# Patient Record
Sex: Male | Born: 1965
Health system: Southern US, Community
[De-identification: ages and names within clinical notes are randomized; demographics above are authoritative.]

## PROBLEM LIST (undated history)

## (undated) DIAGNOSIS — R011 Cardiac murmur, unspecified: Secondary | ICD-10-CM

## (undated) DIAGNOSIS — R569 Unspecified convulsions: Secondary | ICD-10-CM

## (undated) DIAGNOSIS — T7840XA Allergy, unspecified, initial encounter: Secondary | ICD-10-CM

## (undated) HISTORY — DX: Unspecified convulsions: R56.9

## (undated) HISTORY — PX: HERNIA REPAIR: SHX51

## (undated) HISTORY — DX: Allergy, unspecified, initial encounter: T78.40XA

## (undated) HISTORY — PX: VASECTOMY: SHX75

## (undated) HISTORY — DX: Cardiac murmur, unspecified: R01.1

---
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
## 1999-06-24 ENCOUNTER — Encounter: Payer: Self-pay | Admitting: Internal Medicine

## 2003-11-13 ENCOUNTER — Ambulatory Visit (HOSPITAL_BASED_OUTPATIENT_CLINIC_OR_DEPARTMENT_OTHER): Admission: RE | Admit: 2003-11-13 | Discharge: 2003-11-13 | Payer: Self-pay | Admitting: Urology

## 2007-06-12 ENCOUNTER — Encounter: Admission: RE | Admit: 2007-06-12 | Discharge: 2007-06-12 | Payer: Self-pay | Admitting: Orthopedic Surgery

## 2007-08-21 ENCOUNTER — Ambulatory Visit: Payer: Self-pay | Admitting: Internal Medicine

## 2007-08-26 ENCOUNTER — Ambulatory Visit: Payer: Self-pay | Admitting: Family Medicine

## 2007-08-28 LAB — CONVERTED CEMR LAB
Cholesterol: 183 mg/dL (ref 0–200)
HDL: 32.2 mg/dL — ABNORMAL LOW (ref >39.0)
Triglycerides: 54 mg/dL (ref 0–149)
VLDL: 11 mg/dL (ref 0–40)

## 2008-03-03 ENCOUNTER — Ambulatory Visit: Payer: Self-pay | Admitting: Family Medicine

## 2010-08-12 NOTE — Op Note (Signed)
NAMEMEHMET, SCALLY                             ACCOUNT NO.:  [id number]   MEDICAL RECORD NO.:  [id number]                   PATIENT TYPE:  AMB   LOCATION:  [facility]                                 FACILITY:  [facility]   PHYSICIAN:  Rozanna Boer., M.D.      DATE OF BIRTH:  Apr 17, 1965   DATE OF PROCEDURE:  11/13/2003  DATE OF DISCHARGE:                                 OPERATIVE REPORT   PREOPERATIVE DIAGNOSES:  Elective sterilization, multiple groin and penile  condylomata.   POSTOPERATIVE DIAGNOSES:  Elective sterilization, multiple groin and penile  condylomata.   OPERATION:  Bilateral vas ligation, YAG laser of multiple penile and groin  condylomata.   ANESTHESIA:  General.   SURGEON:  Courtney Paris, M.D.   BRIEF HISTORY:  This 45 year old patient is admitted for elective  sterilization to have a vasectomy but was noted to have multiple groin and  penile condylomata because of the size of these things which were more than  2 cm in both groins.  He enters to have these done under general anesthesia  with the YAG laser as well as the vasectomy.   The patient was placed supine, slightly frog legged on the operating table  and after satisfactory induction of general anesthesia was prepped and  draped with Betadine in the usual sterile fashion.  The vas in the left  upper scrotum was picked up and a small transverse incision made across the  upper scrotum on the left side, the vas was delivered in the operative  field, transected and clamped. This was then tied with 3-0 chromic catgut  suture leaving a gap.  The vas was dropped back in the left hemiscrotal  compartment and on the right side, the vas was similarly delivered through a  small transverse upper scrotal incision and the vas was dissected free and  clamped, ligated with 3-0 chromic catgut suture.  Hemostasis was good.  The  skin was closed with interrupted 4-0 chromic catgut suture and dressing  collodion was  applied.   The YAG laser with the contact tip was then calibrated at 15 watts and the  lesion first on the penis which was about a centimeter was photo irradiated  until it turned white.  The large lesions in both groins which were over 2  cm each were then also photo irradiated and I tried to pick off some of the  eschar.  When all the viable wart appeared to be treated on both sides, I  then covered the wounds with Neosporin ointment and he was then taken to the  recovery room in good condition to be later discharged as an outpatient with  detailed written instructions.  Rozanna Boer., M.D.    HMK/MEDQ  D:  11/13/2003  T:  11/14/2003  Job:  [id number]

## 2010-12-13 ENCOUNTER — Other Ambulatory Visit: Payer: Self-pay | Admitting: Ophthalmology

## 2010-12-13 DIAGNOSIS — H4710 Unspecified papilledema: Secondary | ICD-10-CM

## 2010-12-17 ENCOUNTER — Other Ambulatory Visit: Payer: Self-pay

## 2010-12-19 ENCOUNTER — Other Ambulatory Visit: Payer: Self-pay

## 2012-07-05 ENCOUNTER — Ambulatory Visit (INDEPENDENT_AMBULATORY_CARE_PROVIDER_SITE_OTHER): Payer: BC Managed Care – PPO | Admitting: Internal Medicine

## 2012-07-05 ENCOUNTER — Encounter: Payer: Self-pay | Admitting: Internal Medicine

## 2012-07-05 VITALS — BP 130/80 | HR 75 | Wt 219.0 lb

## 2012-07-05 DIAGNOSIS — L918 Other hypertrophic disorders of the skin: Secondary | ICD-10-CM | POA: Insufficient documentation

## 2012-07-05 DIAGNOSIS — L909 Atrophic disorder of skin, unspecified: Secondary | ICD-10-CM

## 2012-07-05 NOTE — Progress Notes (Signed)
  Subjective:    Patient ID: Marcus Cochran, male    DOB: 1965/05/03, 47 y.o.   MRN: [id number]  HPI Hasn't been here in some time Has been doing well  Has 1 irritated skin tag in right axilla Needs this removed Tried tying dental floss but didn't get tight enough  No current outpatient prescriptions on file prior to visit.   No current facility-administered medications on file prior to visit.    Allergies  Allergen Reactions  . Prednisone     REACTION: blurry vision    No past medical history on file.  Past Surgical History  Procedure Laterality Date  . Hernia repair Right ~1972    Inguinal  . Knee arthroscopy Right 1981    Family History  Problem Relation Age of Onset  . Multiple sclerosis Mother     possible    History   Social History  . Marital Status: Married    Spouse Name: N/A    Number of Children: 0  . Years of Education: N/A   Occupational History  . Not on file.   Social History Main Topics  . Smoking status: Never Smoker   . Smokeless tobacco: Never Used  . Alcohol Use: Yes     Comment: 1-2 daily at most  . Drug Use: No  . Sexually Active: Not on file   Other Topics Concern  . Not on file   Social History Narrative  . No narrative on file      Review of Systems     Objective:   Physical Exam  Constitutional: He appears well-developed and well-nourished. No distress.  Skin:  Partially thrombosed skin tag in right axilla tender          Assessment & Plan:

## 2012-07-05 NOTE — Patient Instructions (Signed)

## 2012-07-05 NOTE — Assessment & Plan Note (Signed)
Partially thrombosed and base is inflamed Liquid nitrogen 45 seconds x 2 Discussed home care

## 2012-07-15 ENCOUNTER — Ambulatory Visit: Payer: Self-pay | Admitting: Internal Medicine

## 2013-01-15 ENCOUNTER — Ambulatory Visit (INDEPENDENT_AMBULATORY_CARE_PROVIDER_SITE_OTHER): Payer: BC Managed Care – PPO | Admitting: Internal Medicine

## 2013-01-15 ENCOUNTER — Encounter: Payer: Self-pay | Admitting: Internal Medicine

## 2013-01-15 VITALS — BP 120/70 | HR 72 | Temp 98.9°F | Wt 221.5 lb

## 2013-01-15 DIAGNOSIS — IMO0001 Reserved for inherently not codable concepts without codable children: Secondary | ICD-10-CM | POA: Insufficient documentation

## 2013-01-15 DIAGNOSIS — L03019 Cellulitis of unspecified finger: Secondary | ICD-10-CM

## 2013-01-15 MED ORDER — CEPHALEXIN 500 MG PO CAPS: 500.0000 mg | ORAL_CAPSULE | Freq: Four times a day (QID) | ORAL | Status: DC

## 2013-01-15 NOTE — Assessment & Plan Note (Signed)
Discussed hot packing Will treat with cephalexin

## 2013-01-15 NOTE — Progress Notes (Signed)
  Subjective:    Patient ID: Marcus Cochran, male    DOB: 02-09-1966, 47 y.o.   MRN: [id number]  HPI Has some swelling in right middle finger---distal Tried to see if he could drain it with sterilized straight knife--didn't get any drainage  Doesn't remember injury or bite Does bite nails---may have had trauma to that Tender to touch but not bad pain  No current outpatient prescriptions on file prior to visit.   No current facility-administered medications on file prior to visit.    Allergies  Allergen Reactions  . Prednisone     REACTION: blurry vision    No past medical history on file.  Past Surgical History  Procedure Laterality Date  . Hernia repair Right ~1972    Inguinal  . Knee arthroscopy Right 1981    Family History  Problem Relation Age of Onset  . Multiple sclerosis Mother     possible  . Diabetes Neg Hx   . Heart disease Neg Hx   . Hypertension Neg Hx     History   Social History  . Marital Status: Married    Spouse Name: N/A    Number of Children: 0  . Years of Education: N/A   Occupational History  . [employer]    Social History Main Topics  . Smoking status: Never Smoker   . Smokeless tobacco: Never Used  . Alcohol Use: Yes     Comment: 1-2 daily at most  . Drug Use: No  . Sexual Activity: Not on file   Other Topics Concern  . Not on file   Social History Narrative  . No narrative on file   Review of Systems No fever Not sick    Objective:   Physical Exam  Constitutional: He appears well-developed and well-nourished. No distress.  Musculoskeletal:  Swelling, redness and tenderness of distal right 3rd finger Some green discoloration at nail edge          Assessment & Plan:

## 2013-01-30 ENCOUNTER — Other Ambulatory Visit: Payer: Self-pay

## 2014-01-13 ENCOUNTER — Encounter: Payer: Self-pay | Admitting: Internal Medicine

## 2014-01-13 ENCOUNTER — Ambulatory Visit (INDEPENDENT_AMBULATORY_CARE_PROVIDER_SITE_OTHER): Payer: BC Managed Care – PPO | Admitting: Internal Medicine

## 2014-01-13 VITALS — BP 140/80 | HR 78 | Temp 97.9°F | Wt 224.0 lb

## 2014-01-13 DIAGNOSIS — R251 Tremor, unspecified: Secondary | ICD-10-CM

## 2014-01-13 DIAGNOSIS — Z23 Encounter for immunization: Secondary | ICD-10-CM

## 2014-01-13 LAB — CBC WITH DIFFERENTIAL/PLATELET
BASOS PCT: 0.5 % (ref 0.0–3.0)
Basophils Absolute: 0 10*3/uL (ref 0.0–0.1)
EOS ABS: 0.1 10*3/uL (ref 0.0–0.7)
EOS PCT: 1 % (ref 0.0–5.0)
HCT: 46.5 % (ref 39.0–52.0)
HEMOGLOBIN: 15.7 g/dL (ref 13.0–17.0)
LYMPHS PCT: 33 % (ref 12.0–46.0)
Lymphs Abs: 1.8 10*3/uL (ref 0.7–4.0)
MCHC: 33.7 g/dL (ref 30.0–36.0)
MCV: 90.7 fl (ref 78.0–100.0)
MONOS PCT: 9.9 % (ref 3.0–12.0)
Monocytes Absolute: 0.5 10*3/uL (ref 0.1–1.0)
NEUTROS PCT: 55.6 % (ref 43.0–77.0)
Neutro Abs: 3 10*3/uL (ref 1.4–7.7)
Platelets: 216 10*3/uL (ref 150.0–400.0)
RBC: 5.12 Mil/uL (ref 4.22–5.81)
RDW: 13.2 % (ref 11.5–15.5)
WBC: 5.4 10*3/uL (ref 4.0–10.5)

## 2014-01-13 LAB — COMPREHENSIVE METABOLIC PANEL
ALBUMIN: 4 g/dL (ref 3.5–5.2)
ALK PHOS: 59 U/L (ref 39–117)
ALT: 35 U/L (ref 0–53)
AST: 26 U/L (ref 0–37)
BUN: 16 mg/dL (ref 6–23)
CALCIUM: 9.5 mg/dL (ref 8.4–10.5)
CHLORIDE: 102 meq/L (ref 96–112)
CO2: 27 mEq/L (ref 19–32)
Creatinine, Ser: 1.2 mg/dL (ref 0.4–1.5)
GFR: 71.88 mL/min (ref >60.00)
GLUCOSE: 87 mg/dL (ref 70–99)
POTASSIUM: 4.3 meq/L (ref 3.5–5.1)
SODIUM: 138 meq/L (ref 135–145)
TOTAL PROTEIN: 8 g/dL (ref 6.0–8.3)
Total Bilirubin: 0.9 mg/dL (ref 0.2–1.2)

## 2014-01-13 LAB — LIPID PANEL
CHOL/HDL RATIO: 5
CHOLESTEROL: 238 mg/dL — AB (ref 0–200)
HDL: 43.7 mg/dL (ref >39.00)
LDL CALC: 155 mg/dL — AB (ref 0–99)
NonHDL: 194.3
Triglycerides: 196 mg/dL — ABNORMAL HIGH (ref 0.0–149.0)
VLDL: 39.2 mg/dL (ref 0.0–40.0)

## 2014-01-13 LAB — T4, FREE: Free T4: 1.07 ng/dL (ref 0.60–1.60)

## 2014-01-13 NOTE — Patient Instructions (Signed)
DASH Eating Plan °DASH stands for "Dietary Approaches to Stop Hypertension." The DASH eating plan is a healthy eating plan that has been shown to reduce high blood pressure (hypertension). Additional health benefits may include reducing the risk of type 2 diabetes mellitus, heart disease, and stroke. The DASH eating plan may also help with weight loss. °WHAT DO I NEED TO KNOW ABOUT THE DASH EATING PLAN? °For the DASH eating plan, you will follow these general guidelines: °· Choose foods with a percent daily value for sodium of less than 5% (as listed on the food label). °· Use salt-free seasonings or herbs instead of table salt or sea salt. °· Check with your health care provider or pharmacist before using salt substitutes. °· Eat lower-sodium products, often labeled as "lower sodium" or "no salt added." °· Eat fresh foods. °· Eat more vegetables, fruits, and low-fat dairy products. °· Choose whole grains. Look for the word "whole" as the first word in the ingredient list. °· Choose fish and skinless chicken or turkey more often than red meat. Limit fish, poultry, and meat to 6 oz (170 g) each day. °· Limit sweets, desserts, sugars, and sugary drinks. °· Choose heart-healthy fats. °· Limit cheese to 1 oz (28 g) per day. °· Eat more home-cooked food and less restaurant, buffet, and fast food. °· Limit fried foods. °· Cook foods using methods other than frying. °· Limit canned vegetables. If you do use them, rinse them well to decrease the sodium. °· When eating at a restaurant, ask that your food be prepared with less salt, or no salt if possible. °WHAT FOODS CAN I EAT? °Seek help from a dietitian for individual calorie needs. °Grains °Whole grain or whole wheat bread. Brown rice. Whole grain or whole wheat pasta. Quinoa, bulgur, and whole grain cereals. Low-sodium cereals. Corn or whole wheat flour tortillas. Whole grain cornbread. Whole grain crackers. Low-sodium crackers. °Vegetables °Fresh or frozen vegetables  (raw, steamed, roasted, or grilled). Low-sodium or reduced-sodium tomato and vegetable juices. Low-sodium or reduced-sodium tomato sauce and paste. Low-sodium or reduced-sodium canned vegetables.  °Fruits °All fresh, canned (in natural juice), or frozen fruits. °Meat and Other Protein Products °Ground beef (85% or leaner), grass-fed beef, or beef trimmed of fat. Skinless chicken or turkey. Ground chicken or turkey. Pork trimmed of fat. All fish and seafood. Eggs. Dried beans, peas, or lentils. Unsalted nuts and seeds. Unsalted canned beans. °Dairy °Low-fat dairy products, such as skim or 1% milk, 2% or reduced-fat cheeses, low-fat ricotta or cottage cheese, or plain low-fat yogurt. Low-sodium or reduced-sodium cheeses. °Fats and Oils °Tub margarines without trans fats. Light or reduced-fat mayonnaise and salad dressings (reduced sodium). Avocado. Safflower, olive, or canola oils. Natural peanut or almond butter. °Other °Unsalted popcorn and pretzels. °The items listed above may not be a complete list of recommended foods or beverages. Contact your dietitian for more options. °WHAT FOODS ARE NOT RECOMMENDED? °Grains °White bread. White pasta. White rice. Refined cornbread. Bagels and croissants. Crackers that contain trans fat. °Vegetables °Creamed or fried vegetables. Vegetables in a cheese sauce. Regular canned vegetables. Regular canned tomato sauce and paste. Regular tomato and vegetable juices. °Fruits °Dried fruits. Canned fruit in light or heavy syrup. Fruit juice. °Meat and Other Protein Products °Fatty cuts of meat. Ribs, chicken wings, bacon, sausage, bologna, salami, chitterlings, fatback, hot dogs, bratwurst, and packaged luncheon meats. Salted nuts and seeds. Canned beans with salt. °Dairy °Whole or 2% milk, cream, half-and-half, and cream cheese. Whole-fat or sweetened yogurt. Full-fat   cheeses or blue cheese. Nondairy creamers and whipped toppings. Processed cheese, cheese spreads, or cheese  curds. °Condiments °Onion and garlic salt, seasoned salt, table salt, and sea salt. Canned and packaged gravies. Worcestershire sauce. Tartar sauce. Barbecue sauce. Teriyaki sauce. Soy sauce, including reduced sodium. Steak sauce. Fish sauce. Oyster sauce. Cocktail sauce. Horseradish. Ketchup and mustard. Meat flavorings and tenderizers. Bouillon cubes. Hot sauce. Tabasco sauce. Marinades. Taco seasonings. Relishes. °Fats and Oils °Butter, stick margarine, lard, shortening, ghee, and bacon fat. Coconut, palm kernel, or palm oils. Regular salad dressings. °Other °Pickles and olives. Salted popcorn and pretzels. °The items listed above may not be a complete list of foods and beverages to avoid. Contact your dietitian for more information. °WHERE CAN I FIND MORE INFORMATION? °National Heart, [employer], and [employer] Institute: www.nhlbi.nih.gov/health/health-topics/topics/dash/ °Document Released: 03/02/2011 Document Revised: 07/28/2013 Document Reviewed: 01/15/2013 °Exit[employer]® Patient Information ©2015 [employer]. This information is not intended to replace advice given to you by your health care provider. Make sure you discuss any questions you have with your health care provider. ° °

## 2014-01-13 NOTE — Progress Notes (Signed)
Pre visit review using our clinic review tool, if applicable. No additional management support is needed unless otherwise documented below in the visit note. 

## 2014-01-13 NOTE — Progress Notes (Signed)
   Subjective:    Patient ID: Marcus Cochran, male    DOB: April 18, 1965, 48 y.o.   MRN: [id number]  HPI Wife worried about him being diabetic While on tennis court--started to get the shakes after exerting energy Better after gatorade powder Does have increased urinary frequency--more urgency No polydipsia  Worried about B12 also No parasthesias or memory changes  Weight is stable Wearing heavy work boots Does play tennis at least once a week--- singles and doubles. 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   No current facility-administered medications on file prior to visit.    Allergies  Allergen Reactions  . Prednisone     REACTION: blurry vision    No past medical history on file.  Past Surgical History  Procedure Laterality Date  . Hernia repair Right ~1972    Inguinal  . Knee arthroscopy Right 1981    Family History  Problem Relation Age of Onset  . Multiple sclerosis Mother     possible  . Diabetes Neg Hx   . Heart disease Neg Hx   . Hypertension Neg Hx     History   Social History  . Marital Status: Married    Spouse Name: N/A    Number of Children: 0  . Years of Education: N/A   Occupational History  . [employer]    Social History Main Topics  . Smoking status: Never Smoker   . Smokeless tobacco: Never Used  . Alcohol Use: Yes     Comment: 1-2 daily at most  . Drug Use: No  . Sexual Activity: Not on file   Other Topics Concern  . Not on file   Social History Narrative  . No narrative on file    Review of Systems Brews beer--1-2 many days and as much as 3-4 on a weekend Sleeps okay    Objective:   Physical Exam  Constitutional: He is oriented to person, place, and time. He appears well-developed and well-nourished. No  distress.  HENT:  Mouth/Throat: Oropharynx is clear and moist. No oropharyngeal exudate.  Neck: Normal range of motion. Neck supple. No thyromegaly present.  Cardiovascular: Normal rate, regular rhythm, normal heart sounds and intact distal pulses.  Exam reveals no gallop.   No murmur heard. Pulmonary/Chest: Effort normal and breath sounds normal. No respiratory distress. He has no wheezes. He has no rales.  Abdominal: Soft. There is no tenderness.  Musculoskeletal: He exhibits no edema and no tenderness.  Lymphadenopathy:    He has no cervical adenopathy.  Neurological: He is alert and oriented to person, place, and time.  Skin: No rash noted. No erythema.  Psychiatric: He has a normal mood and affect. His behavior is normal.          Assessment & Plan:

## 2014-01-13 NOTE — Assessment & Plan Note (Signed)
Sounds like he may have had brief low sugar reaction I doubt diabetes Discussed proper eating and his expected ideal body weight Will check labs

## 2014-01-13 NOTE — Addendum Note (Signed)
Addended by: Despina Hidden on: 01/13/2014 12:08 PM   Modules accepted: Orders

## 2014-01-23 ENCOUNTER — Ambulatory Visit: Payer: BC Managed Care – PPO | Admitting: Internal Medicine

## 2015-01-02 ENCOUNTER — Encounter: Payer: Self-pay | Admitting: Internal Medicine

## 2017-04-23 ENCOUNTER — Ambulatory Visit (INDEPENDENT_AMBULATORY_CARE_PROVIDER_SITE_OTHER): Payer: Managed Care, Other (non HMO) | Admitting: Internal Medicine

## 2017-04-23 ENCOUNTER — Encounter: Payer: Self-pay | Admitting: Internal Medicine

## 2017-04-23 VITALS — BP 122/70 | HR 89 | Temp 97.9°F | Resp 18 | Wt 228.8 lb

## 2017-04-23 DIAGNOSIS — J22 Unspecified acute lower respiratory infection: Secondary | ICD-10-CM | POA: Diagnosis not present

## 2017-04-23 MED ORDER — AZITHROMYCIN 250 MG PO TABS: ORAL_TABLET | ORAL | 0 refills | Status: DC

## 2017-04-23 NOTE — Assessment & Plan Note (Signed)
History most consistent with atypical infection Discussed options ---will treat with z-pak OTC cough meds

## 2017-04-23 NOTE — Progress Notes (Signed)
   Subjective:    Patient ID: Marcus Cochran, male    DOB: 1965/06/21, 52 y.o.   MRN: [id number]  HPI Here due to respiratory illness  Having chest congestion now Sinus problems about a month ago--better with sinus meds and vitamin C Seemed to improve a couple of weeks ago--with some cough and wheezing at various times of the day Tried cough syrup--helped him last night  Not really SOB Intermittent cough---slightly productive No fever No chills or sweats No ear pain Slight sore throat  No current outpatient medications on file prior to visit.   No current facility-administered medications on file prior to visit.     Allergies  Allergen Reactions  . Prednisone     REACTION: blurry vision    History reviewed. No pertinent past medical history.  Past Surgical History:  Procedure Laterality Date  . HERNIA REPAIR Right ~1972   Inguinal  . KNEE ARTHROSCOPY Right 1981    Family History  Problem Relation Age of Onset  . Multiple sclerosis Mother        possible  . Diabetes Neg Hx   . Heart disease Neg Hx   . Hypertension Neg Hx     Social History   Socioeconomic History  . Marital status: Married    Spouse name: Not on file  . Number of children: 0  . Years of education: Not on file  . Highest education level: Not on file  Social Needs  . Financial resource strain: Not on file  . Food insecurity - worry: Not on file  . Food insecurity - inability: Not on file  . Transportation needs - medical: Not on file  . Transportation needs - non-medical: Not on file  Occupational History  . Occupation: Chief Financial Officer  Tobacco Use  . Smoking status: Never Smoker  . Smokeless tobacco: Never Used  Substance and Sexual Activity  . Alcohol use: Yes    Comment: 1-2 daily at most  . Drug use: No  . Sexual activity: Not on file  Other Topics Concern  . Not on file  Social History Narrative  . Not on file   Review of Systems Very thirsty--- and drinks  water Appetite is fine No vomiting or diarrhea No rash Wife was sick--- now clearing up    Objective:   Physical Exam  Constitutional: He appears well-developed. No distress.  HENT:  Mouth/Throat: Oropharynx is clear and moist. No oropharyngeal exudate.  No sinus tenderness Tms normal Mild nasal congestion   Neck: No thyromegaly present.  Pulmonary/Chest: Effort normal. No respiratory distress. He has no wheezes. He has no rales.  Lymphadenopathy:    He has no cervical adenopathy.          Assessment & Plan:

## 2017-10-23 ENCOUNTER — Ambulatory Visit: Payer: Self-pay | Admitting: Internal Medicine

## 2017-10-31 ENCOUNTER — Encounter: Payer: Self-pay | Admitting: Gastroenterology

## 2017-10-31 ENCOUNTER — Encounter: Payer: Self-pay | Admitting: Internal Medicine

## 2017-10-31 ENCOUNTER — Ambulatory Visit (INDEPENDENT_AMBULATORY_CARE_PROVIDER_SITE_OTHER): Payer: Managed Care, Other (non HMO) | Admitting: Internal Medicine

## 2017-10-31 VITALS — BP 124/84 | HR 70 | Temp 98.0°F | Ht 70.5 in | Wt 219.0 lb

## 2017-10-31 DIAGNOSIS — Z0000 Encounter for general adult medical examination without abnormal findings: Secondary | ICD-10-CM | POA: Diagnosis not present

## 2017-10-31 DIAGNOSIS — Z1211 Encounter for screening for malignant neoplasm of colon: Secondary | ICD-10-CM | POA: Diagnosis not present

## 2017-10-31 LAB — LIPID PANEL
CHOLESTEROL: 198 mg/dL (ref 0–200)
HDL: 45.5 mg/dL (ref >39.00)
LDL CALC: 126 mg/dL — AB (ref 0–99)
NonHDL: 152.17
TRIGLYCERIDES: 129 mg/dL (ref 0.0–149.0)
Total CHOL/HDL Ratio: 4
VLDL: 25.8 mg/dL (ref 0.0–40.0)

## 2017-10-31 LAB — GLUCOSE, RANDOM: GLUCOSE: 98 mg/dL (ref 70–99)

## 2017-10-31 NOTE — Patient Instructions (Signed)
DASH Eating Plan DASH stands for "Dietary Approaches to Stop Hypertension." The DASH eating plan is a healthy eating plan that has been shown to reduce high blood pressure (hypertension). It may also reduce your risk for type 2 diabetes, heart disease, and stroke. The DASH eating plan may also help with weight loss. What are tips for following this plan? General guidelines  Avoid eating more than 2,300 mg (milligrams) of salt (sodium) a day. If you have hypertension, you may need to reduce your sodium intake to 1,500 mg a day.  Limit alcohol intake to no more than 1 drink a day for nonpregnant women and 2 drinks a day for men. One drink equals 12 oz of beer, 5 oz of wine, or 1 oz of hard liquor.  Work with your health care provider to maintain a healthy body weight or to lose weight. Ask what an ideal weight is for you.  Get at least 30 minutes of exercise that causes your heart to beat faster (aerobic exercise) most days of the week. Activities may include walking, swimming, or biking.  Work with your health care provider or diet and nutrition specialist (dietitian) to adjust your eating plan to your individual calorie needs. Reading food labels  Check food labels for the amount of sodium per serving. Choose foods with less than 5 percent of the Daily Value of sodium. Generally, foods with less than 300 mg of sodium per serving fit into this eating plan.  To find whole grains, look for the word "whole" as the first word in the ingredient list. Shopping  Buy products labeled as "low-sodium" or "no salt added."  Buy fresh foods. Avoid canned foods and premade or frozen meals. Cooking  Avoid adding salt when cooking. Use salt-free seasonings or herbs instead of table salt or sea salt. Check with your health care provider or pharmacist before using salt substitutes.  Do not fry foods. Cook foods using healthy methods such as baking, boiling, grilling, and broiling instead.  Cook with  heart-healthy oils, such as olive, canola, soybean, or sunflower oil. Meal planning   Eat a balanced diet that includes: ? 5 or more servings of fruits and vegetables each day. At each meal, try to fill half of your plate with fruits and vegetables. ? Up to 6-8 servings of whole grains each day. ? Less than 6 oz of lean meat, poultry, or fish each day. A 3-oz serving of meat is about the same size as a deck of cards. One egg equals 1 oz. ? 2 servings of low-fat dairy each day. ? A serving of nuts, seeds, or beans 5 times each week. ? Heart-healthy fats. Healthy fats called Omega-3 fatty acids are found in foods such as flaxseeds and coldwater fish, like sardines, salmon, and mackerel.  Limit how much you eat of the following: ? Canned or prepackaged foods. ? Food that is high in trans fat, such as fried foods. ? Food that is high in saturated fat, such as fatty meat. ? Sweets, desserts, sugary drinks, and other foods with added sugar. ? Full-fat dairy products.  Do not salt foods before eating.  Try to eat at least 2 vegetarian meals each week.  Eat more home-cooked food and less restaurant, buffet, and fast food.  When eating at a restaurant, ask that your food be prepared with less salt or no salt, if possible. What foods are recommended? The items listed may not be a complete list. Talk with your dietitian about what   dietary choices are best for you. Grains Whole-grain or whole-wheat bread. Whole-grain or whole-wheat pasta. Brown rice. Oatmeal. Quinoa. Bulgur. Whole-grain and low-sodium cereals. Pita bread. Low-fat, low-sodium crackers. Whole-wheat flour tortillas. Vegetables Fresh or frozen vegetables (raw, steamed, roasted, or grilled). Low-sodium or reduced-sodium tomato and vegetable juice. Low-sodium or reduced-sodium tomato sauce and tomato paste. Low-sodium or reduced-sodium canned vegetables. Fruits All fresh, dried, or frozen fruit. Canned fruit in natural juice (without  added sugar). Meat and other protein foods Skinless chicken or turkey. Ground chicken or turkey. Pork with fat trimmed off. Fish and seafood. Egg whites. Dried beans, peas, or lentils. Unsalted nuts, nut butters, and seeds. Unsalted canned beans. Lean cuts of beef with fat trimmed off. Low-sodium, lean deli meat. Dairy Low-fat (1%) or fat-free (skim) milk. Fat-free, low-fat, or reduced-fat cheeses. Nonfat, low-sodium ricotta or cottage cheese. Low-fat or nonfat yogurt. Low-fat, low-sodium cheese. Fats and oils Soft margarine without trans fats. Vegetable oil. Low-fat, reduced-fat, or light mayonnaise and salad dressings (reduced-sodium). Canola, safflower, olive, soybean, and sunflower oils. Avocado. Seasoning and other foods Herbs. Spices. Seasoning mixes without salt. Unsalted popcorn and pretzels. Fat-free sweets. What foods are not recommended? The items listed may not be a complete list. Talk with your dietitian about what dietary choices are best for you. Grains Baked goods made with fat, such as croissants, muffins, or some breads. Dry pasta or rice meal packs. Vegetables Creamed or fried vegetables. Vegetables in a cheese sauce. Regular canned vegetables (not low-sodium or reduced-sodium). Regular canned tomato sauce and paste (not low-sodium or reduced-sodium). Regular tomato and vegetable juice (not low-sodium or reduced-sodium). Pickles. Olives. Fruits Canned fruit in a light or heavy syrup. Fried fruit. Fruit in cream or butter sauce. Meat and other protein foods Fatty cuts of meat. Ribs. Fried meat. Bacon. Sausage. Bologna and other processed lunch meats. Salami. Fatback. Hotdogs. Bratwurst. Salted nuts and seeds. Canned beans with added salt. Canned or smoked fish. Whole eggs or egg yolks. Chicken or turkey with skin. Dairy Whole or 2% milk, cream, and half-and-half. Whole or full-fat cream cheese. Whole-fat or sweetened yogurt. Full-fat cheese. Nondairy creamers. Whipped toppings.  Processed cheese and cheese spreads. Fats and oils Butter. Stick margarine. Lard. Shortening. Ghee. Bacon fat. Tropical oils, such as coconut, palm kernel, or palm oil. Seasoning and other foods Salted popcorn and pretzels. Onion salt, garlic salt, seasoned salt, table salt, and sea salt. Worcestershire sauce. Tartar sauce. Barbecue sauce. Teriyaki sauce. Soy sauce, including reduced-sodium. Steak sauce. Canned and packaged gravies. Fish sauce. Oyster sauce. Cocktail sauce. Horseradish that you find on the shelf. Ketchup. Mustard. Meat flavorings and tenderizers. Bouillon cubes. Hot sauce and Tabasco sauce. Premade or packaged marinades. Premade or packaged taco seasonings. Relishes. Regular salad dressings. Where to find more information:  National Heart, Lung, and Blood Institute: www.nhlbi.nih.gov  American Heart Association: www.heart.org Summary  The DASH eating plan is a healthy eating plan that has been shown to reduce high blood pressure (hypertension). It may also reduce your risk for type 2 diabetes, heart disease, and stroke.  With the DASH eating plan, you should limit salt (sodium) intake to 2,300 mg a day. If you have hypertension, you may need to reduce your sodium intake to 1,500 mg a day.  When on the DASH eating plan, aim to eat more fresh fruits and vegetables, whole grains, lean proteins, low-fat dairy, and heart-healthy fats.  Work with your health care provider or diet and nutrition specialist (dietitian) to adjust your eating plan to your individual   calorie needs. This information is not intended to replace advice given to you by your health care provider. Make sure you discuss any questions you have with your health care provider. Document Released: 03/02/2011 Document Revised: 03/06/2016 Document Reviewed: 03/06/2016 Elsevier Interactive Patient Education  2018 Elsevier Inc.  

## 2017-10-31 NOTE — Assessment & Plan Note (Signed)
Healthy Discussed fitness Recommended yearly flu shot--he is not sure Will set up colon Consider PSA at 57

## 2017-10-31 NOTE — Progress Notes (Signed)
Subjective:    Patient ID: Marcus Cochran, male    DOB: November 06, 1965, 52 y.o.   MRN: [id number]  HPI Here for physical  No new health concerns Same job  No current outpatient medications on file prior to visit.   No current facility-administered medications on file prior to visit.     Allergies  Allergen Reactions  . Prednisone     REACTION: blurry vision    History reviewed. No pertinent past medical history.  Past Surgical History:  Procedure Laterality Date  . HERNIA REPAIR Right ~1972   Inguinal  . KNEE ARTHROSCOPY Right 1981    Family History  Problem Relation Age of Onset  . Multiple sclerosis Mother        possible  . Diabetes Neg Hx   . Heart disease Neg Hx   . Hypertension Neg Hx     Social History   Socioeconomic History  . Marital status: Married    Spouse name: Not on file  . Number of children: 0  . Years of education: Not on file  . Highest education level: Not on file  Occupational History  . 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 Financial resource strain: Not on file  . Food insecurity:    Worry: Not on file    Inability: Not on file  . Transportation needs:    Medical: Not on file    Non-medical: Not on file  Tobacco Use  . Smoking status: Never Smoker  . Smokeless tobacco: Never Used  Substance and Sexual Activity  . Alcohol use: Yes    Comment: 1-2 daily at most  . Drug use: No  . Sexual activity: Not on file  Lifestyle  . Physical activity:    Days per week: Not on file    Minutes per session: Not on file  . Stress: Not on file  Relationships  . Social connections:    Talks on phone: Not on file    Gets together: Not on file    Attends religious service: Not on file    Active member of club or organization: Not on file    Attends meetings of clubs or organizations: Not on file    Relationship status: Not on file  . Intimate partner violence:    Fear of current or ex partner: Not on file    Emotionally abused:  Not on file    Physically abused: Not on file    Forced sexual activity: Not on file  Other Topics Concern  . Not on file  Social History Narrative  . Not on file   Review of Systems  Constitutional:       Plays tennis twice a week or more Starting resistance bands Wears seat belt  HENT: Negative for dental problem, hearing loss, tinnitus and trouble swallowing.        Keeps up with dentist  Eyes: Negative for visual disturbance.       No diplopia or unilateral vision loss  Respiratory: Negative for cough, chest tightness and shortness of breath.   Cardiovascular: Negative for chest pain, palpitations and leg swelling.  Gastrointestinal: Negative for blood in stool and constipation.       No heartburn  Endocrine: Negative for polydipsia and polyuria.  Genitourinary: Negative for difficulty urinating and urgency.       No sexual problems  Musculoskeletal: Negative for arthralgias and joint swelling.       Recurrent low back pain--only once in a while  Skin:  Negative for rash.       No suspicious lesions but more sun spots  Allergic/Immunologic: Negative for environmental allergies and immunocompromised state.  Neurological: Negative for dizziness, syncope, light-headedness and headaches.  Hematological: Negative for adenopathy. Does not bruise/bleed easily.  Psychiatric/Behavioral: Negative for dysphoric mood and sleep disturbance. The patient is not nervous/anxious.        Objective:   Physical Exam  Constitutional: He is oriented to person, place, and time. He appears well-developed. No distress.  HENT:  Head: Normocephalic and atraumatic.  Right Ear: External ear normal.  Left Ear: External ear normal.  Mouth/Throat: Oropharynx is clear and moist. No oropharyngeal exudate.  Eyes: Pupils are equal, round, and reactive to light. Conjunctivae are normal.  Neck: No thyromegaly present.  Cardiovascular: Normal rate, regular rhythm, normal heart sounds and intact distal  pulses. Exam reveals no gallop.  No murmur heard. Respiratory: Effort normal and breath sounds normal. No respiratory distress. He has no wheezes. He has no rales.  GI: Soft. There is no tenderness.  Musculoskeletal: He exhibits no edema or tenderness.  Lymphadenopathy:    He has no cervical adenopathy.  Neurological: He is alert and oriented to person, place, and time.  Skin: No rash noted. No erythema.  Multiple benign nevi  Psychiatric: He has a normal mood and affect. His behavior is normal.           Assessment & Plan:

## 2017-12-14 ENCOUNTER — Ambulatory Visit (AMBULATORY_SURGERY_CENTER): Payer: Self-pay

## 2017-12-14 ENCOUNTER — Encounter: Payer: Self-pay | Admitting: Gastroenterology

## 2017-12-14 VITALS — Ht 71.0 in | Wt 221.0 lb

## 2017-12-14 DIAGNOSIS — Z1211 Encounter for screening for malignant neoplasm of colon: Secondary | ICD-10-CM

## 2017-12-14 MED ORDER — NA SULFATE-K SULFATE-MG SULF 17.5-3.13-1.6 GM/177ML PO SOLN: 1.0000 | Freq: Once | ORAL | 0 refills | Status: AC

## 2017-12-14 NOTE — Progress Notes (Signed)
Per pt, no allergies to soy or egg products.Pt not taking any weight loss meds or using  O2 at home.  Pt refused emmi video. 

## 2017-12-28 ENCOUNTER — Encounter: Payer: Self-pay | Admitting: Gastroenterology

## 2017-12-28 ENCOUNTER — Ambulatory Visit (AMBULATORY_SURGERY_CENTER): Payer: Managed Care, Other (non HMO) | Admitting: Gastroenterology

## 2017-12-28 VITALS — BP 119/70 | HR 59 | Temp 99.3°F | Resp 13 | Ht 70.5 in | Wt 219.0 lb

## 2017-12-28 DIAGNOSIS — D122 Benign neoplasm of ascending colon: Secondary | ICD-10-CM | POA: Diagnosis not present

## 2017-12-28 DIAGNOSIS — Z1211 Encounter for screening for malignant neoplasm of colon: Secondary | ICD-10-CM | POA: Diagnosis present

## 2017-12-28 MED ORDER — SODIUM CHLORIDE 0.9 % IV SOLN: 500.0000 mL | Freq: Once | INTRAVENOUS | Status: DC

## 2017-12-28 NOTE — Progress Notes (Signed)
No changes in medical or surgical hx since PV per pt 

## 2017-12-28 NOTE — Patient Instructions (Signed)
**   Handouts given on polyps and diverticulosis **   YOU HAD AN ENDOSCOPIC PROCEDURE TODAY AT THE [facility]:   Refer to the procedure report that was given to you for any specific questions about what was found during the examination.  If the procedure report does not answer your questions, please call your gastroenterologist to clarify.  If you requested that your care partner not be given the details of your procedure findings, then the procedure report has been included in a sealed envelope for you to review at your convenience later.  YOU SHOULD EXPECT: Some feelings of bloating in the abdomen. Passage of more gas than usual.  Walking can help get rid of the air that was put into your GI tract during the procedure and reduce the bloating. If you had a lower endoscopy (such as a colonoscopy or flexible sigmoidoscopy) you may notice spotting of blood in your stool or on the toilet paper. If you underwent a bowel prep for your procedure, you may not have a normal bowel movement for a few days.  Please Note:  You might notice some irritation and congestion in your nose or some drainage.  This is from the oxygen used during your procedure.  There is no need for concern and it should clear up in a day or so.  SYMPTOMS TO REPORT IMMEDIATELY:   Following lower endoscopy (colonoscopy or flexible sigmoidoscopy):  Excessive amounts of blood in the stool  Significant tenderness or worsening of abdominal pains  Swelling of the abdomen that is new, acute  Fever of 100F or higher  For urgent or emergent issues, a gastroenterologist can be reached at any hour by calling [phone].   DIET:  We do recommend a small meal at first, but then you may proceed to your regular diet.  Drink plenty of fluids but you should avoid alcoholic beverages for 24 hours.  ACTIVITY:  You should plan to take it easy for the rest of today and you should NOT DRIVE or use heavy machinery until tomorrow (because  of the sedation medicines used during the test).    FOLLOW UP: Our staff will call the number listed on your records the next business day following your procedure to check on you and address any questions or concerns that you may have regarding the information given to you following your procedure. If we do not reach you, we will leave a message.  However, if you are feeling well and you are not experiencing any problems, there is no need to return our call.  We will assume that you have returned to your regular daily activities without incident.  If any biopsies were taken you will be contacted by phone or by letter within the next 1-3 weeks.  Please call us at [phone] if you have not heard about the biopsies in 3 weeks.    SIGNATURES/CONFIDENTIALITY: You and/or your care partner have signed paperwork which will be entered into your electronic medical record.  These signatures attest to the fact that that the information above on your After Visit Summary has been reviewed and is understood.  Full responsibility of the confidentiality of this discharge information lies with you and/or your care-partner. 

## 2017-12-28 NOTE — Op Note (Signed)
[place] Patient Name: Marcus Cochran Procedure Date: 12/28/2017 10:27 AM MRN: [id number] Endoscopist: Mallie Mussel L. Loletha Carrow , MD Age: 52 Referring MD:  Date of Birth: May 22, 1965 Gender: Male Account #: [id number] Procedure:                Colonoscopy Indications:              Screening for colorectal malignant neoplasm, This                            is the patient's first colonoscopy Medicines:                Monitored Anesthesia Care Procedure:                Pre-Anesthesia Assessment:                           - Prior to the procedure, a History and Physical                            was performed, and patient medications and                            allergies were reviewed. The patient's tolerance of                            previous anesthesia was also reviewed. The risks                            and benefits of the procedure and the sedation                            options and risks were discussed with the patient.                            All questions were answered, and informed consent                            was obtained. Prior Anticoagulants: The patient has                            taken no previous anticoagulant or antiplatelet                            agents. ASA Grade Assessment: II - A patient with                            mild systemic disease. After reviewing the risks                            and benefits, the patient was deemed in                            satisfactory condition to undergo the procedure.  After obtaining informed consent, the colonoscope                            was passed under direct vision. Throughout the                            procedure, the patient's blood pressure, pulse, and                            oxygen saturations were monitored continuously. The                            Colonoscope was introduced through the anus and                            advanced to the the cecum,  identified by                            appendiceal orifice and ileocecal valve. The                            colonoscopy was performed without difficulty. The                            patient tolerated the procedure well. The quality                            of the bowel preparation was good. The ileocecal                            valve, appendiceal orifice, and rectum were                            photographed. The quality of the bowel preparation                            was evaluated using the BBPS [facility] Bowel                            Preparation Scale) with scores of: Right Colon = 2,                            Transverse Colon = 2 and Left Colon = 2. The total                            BBPS score equals 6. The bowel preparation used was                            SUPREP. Scope In: 10:41:53 AM Scope Out: 10:59:38 AM Scope Withdrawal Time: 0 hours 14 minutes 4 seconds  Total Procedure Duration: 0 hours 17 minutes 45 seconds  Findings:                 The perianal and digital rectal  examinations were                            normal.                           An 8-10 mm polyp with a mucus cap was found in the                            proximal ascending colon. The polyp was sessile.                            The polyp was removed with a hot snare. Resection                            and retrieval were complete.                           A 2 mm polyp was found in the distal ascending                            colon. The polyp was sessile and                            adenomatous-appearing under WL and NBI. The polyp                            was removed with a cold snare. Resection was                            complete, but the polyp tissue was not retrieved                            (destroyed by scope suction).                           Multiple diverticula were found in the left colon.                           The exam was otherwise without abnormality  on                            direct and retroflexion views. Complications:            No immediate complications. Estimated Blood Loss:     Estimated blood loss was minimal. Impression:               - One 8-10 mm polyp in the proximal ascending                            colon, removed with a hot snare. Resected and                            retrieved.                           -  One 2 mm polyp in the distal ascending colon,                            removed with a cold snare. Complete resection.                            Polyp tissue not retrieved.                           - Diverticulosis in the left colon.                           - The examination was otherwise normal on direct                            and retroflexion views. Recommendation:           - Patient has a contact number available for                            emergencies. The signs and symptoms of potential                            delayed complications were discussed with the                            patient. Return to normal activities tomorrow.                            Written discharge instructions were provided to the                            patient.                           - Resume previous diet.                           - Continue present medications.                           - Await pathology results.                           - Repeat colonoscopy is recommended for                            surveillance. The colonoscopy date will be                            determined after pathology results from today's                            exam become available for review. Mariellen Blaney L. Loletha Carrow, MD 12/28/2017 11:04:26 AM This report has been signed electronically.

## 2017-12-28 NOTE — Progress Notes (Signed)
To recovery, report to RN, VSS. 

## 2017-12-28 NOTE — Progress Notes (Signed)
Called to room to assist during endoscopic procedure.  Patient ID and intended procedure confirmed with present staff. Received instructions for my participation in the procedure from the performing physician.  

## 2017-12-31 ENCOUNTER — Telehealth: Payer: Self-pay

## 2017-12-31 NOTE — Telephone Encounter (Signed)
  Follow up Call-  Call back number 12/28/2017  Post procedure Call Back phone  # [phone]  Permission to leave phone message Yes  Some recent data might be hidden     Left message

## 2017-12-31 NOTE — Telephone Encounter (Signed)
  Follow up Call-  Call back number 12/28/2017  Post procedure Call Back phone  # [phone]  Permission to leave phone message Yes  Some recent data might be hidden     Patient questions:  Do you have a fever, pain , or abdominal swelling? No. Pain Score  0 *  Have you tolerated food without any problems? Yes.    Have you been able to return to your normal activities? Yes.    Do you have any questions about your discharge instructions: Diet   No. Medications  No. Follow up visit  No.  Do you have questions or concerns about your Care? No.  Actions: * If pain score is 4 or above: No action needed, pain <4.

## 2018-01-04 ENCOUNTER — Encounter: Payer: Self-pay | Admitting: Gastroenterology

## 2018-04-04 DIAGNOSIS — M722 Plantar fascial fibromatosis: Secondary | ICD-10-CM | POA: Diagnosis not present

## 2018-04-04 DIAGNOSIS — M9906 Segmental and somatic dysfunction of lower extremity: Secondary | ICD-10-CM | POA: Diagnosis not present

## 2018-04-04 DIAGNOSIS — M25531 Pain in right wrist: Secondary | ICD-10-CM | POA: Diagnosis not present

## 2018-04-04 DIAGNOSIS — M25671 Stiffness of right ankle, not elsewhere classified: Secondary | ICD-10-CM | POA: Diagnosis not present

## 2018-04-11 DIAGNOSIS — M25671 Stiffness of right ankle, not elsewhere classified: Secondary | ICD-10-CM | POA: Diagnosis not present

## 2018-04-11 DIAGNOSIS — M25531 Pain in right wrist: Secondary | ICD-10-CM | POA: Diagnosis not present

## 2018-04-11 DIAGNOSIS — M722 Plantar fascial fibromatosis: Secondary | ICD-10-CM | POA: Diagnosis not present

## 2018-04-11 DIAGNOSIS — M9906 Segmental and somatic dysfunction of lower extremity: Secondary | ICD-10-CM | POA: Diagnosis not present

## 2018-04-18 DIAGNOSIS — M25531 Pain in right wrist: Secondary | ICD-10-CM | POA: Diagnosis not present

## 2018-04-18 DIAGNOSIS — M25671 Stiffness of right ankle, not elsewhere classified: Secondary | ICD-10-CM | POA: Diagnosis not present

## 2018-04-18 DIAGNOSIS — M9906 Segmental and somatic dysfunction of lower extremity: Secondary | ICD-10-CM | POA: Diagnosis not present

## 2018-04-18 DIAGNOSIS — M722 Plantar fascial fibromatosis: Secondary | ICD-10-CM | POA: Diagnosis not present

## 2018-04-30 DIAGNOSIS — M9906 Segmental and somatic dysfunction of lower extremity: Secondary | ICD-10-CM | POA: Diagnosis not present

## 2018-04-30 DIAGNOSIS — M722 Plantar fascial fibromatosis: Secondary | ICD-10-CM | POA: Diagnosis not present

## 2018-04-30 DIAGNOSIS — M25671 Stiffness of right ankle, not elsewhere classified: Secondary | ICD-10-CM | POA: Diagnosis not present

## 2018-04-30 DIAGNOSIS — M7918 Myalgia, other site: Secondary | ICD-10-CM | POA: Diagnosis not present

## 2018-10-11 DIAGNOSIS — M545 Low back pain: Secondary | ICD-10-CM | POA: Diagnosis not present

## 2018-10-11 DIAGNOSIS — M542 Cervicalgia: Secondary | ICD-10-CM | POA: Diagnosis not present

## 2018-10-11 DIAGNOSIS — M65849 Other synovitis and tenosynovitis, unspecified hand: Secondary | ICD-10-CM | POA: Diagnosis not present

## 2018-10-11 DIAGNOSIS — M9901 Segmental and somatic dysfunction of cervical region: Secondary | ICD-10-CM | POA: Diagnosis not present

## 2018-12-29 DIAGNOSIS — Z20828 Contact with and (suspected) exposure to other viral communicable diseases: Secondary | ICD-10-CM | POA: Diagnosis not present

## 2019-06-13 ENCOUNTER — Ambulatory Visit (INDEPENDENT_AMBULATORY_CARE_PROVIDER_SITE_OTHER): Payer: BC Managed Care – PPO | Admitting: Internal Medicine

## 2019-06-13 ENCOUNTER — Other Ambulatory Visit: Payer: Self-pay

## 2019-06-13 ENCOUNTER — Encounter: Payer: Self-pay | Admitting: Internal Medicine

## 2019-06-13 DIAGNOSIS — R41840 Attention and concentration deficit: Secondary | ICD-10-CM

## 2019-06-13 NOTE — Assessment & Plan Note (Signed)
Clearly has fatigue, sleep disturbance and attention problems related to his current work schedule Discussed that due to clear reason for his problems, he doesn't have ADHD I can certify that he is medically not able to do the split shifts (causing significant medical issues)---he will let me know if he needs this

## 2019-06-13 NOTE — Progress Notes (Signed)
Subjective:    Patient ID: Marcus Cochran, male    DOB: 09-Aug-1965, 54 y.o.   MRN: [id number]  HPI Here due to concerns about ADHD This visit occurred during the SARS-CoV-2 public health emergency.  Safety protocols were in place, including screening questions prior to the visit, additional usage of staff PPE, and extensive cleaning of exam room while observing appropriate contact time as indicated for disinfecting solutions.    "Nothing positive---work is wearing me out" Working double swing shifts---first and third often Will come in 8-4PM---then has to come back at midnight to 5-6AM Only tries to get nap in between. Will be off the next day--then back to first shift (and sometimes the night also) Boss got on him some---had to repeat something he made a mistake on and had to be done after hours Normal days are Mon-Fri 8-4PM On call a week at a time--- 24/7 (7 weeks a year). Not having to go out as much lately, but more often in the past  Wife wondered about ADHD Saw a counselor and got papers filled out Always had trouble focusing for long periods of time Tends to daydream--but okay if clear "task at hand"  Current Outpatient Medications on File Prior to Visit  Medication Sig Dispense Refill  . Cyanocobalamin (B-12) 1000 MCG SUBL Place under the tongue daily.    . Multiple Vitamins-Minerals (EMERGEN-C IMMUNE PO) Take by mouth as needed.    . TURMERIC PO Take by mouth daily.    Marland Kitchen b complex vitamins tablet Take 1 tablet by mouth daily.    . Multiple Vitamin (MULTIVITAMIN) tablet Take 1 tablet by mouth daily.     No current facility-administered medications on file prior to visit.    Allergies  Allergen Reactions  . Prednisone     REACTION: blurry vision    Past Medical History:  Diagnosis Date  . Allergy    seasonal  . Heart murmur    as a 54 year old  . Seizures ([place])    as a child and when smoking pot/ no meds- last one in his 20's    Past Surgical History:   Procedure Laterality Date  . HERNIA REPAIR Right ~1972   Inguinal  . KNEE ARTHROSCOPY Right 1981  . VASECTOMY      Family History  Problem Relation Age of Onset  . Multiple sclerosis Mother        possible  . Bipolar disorder Sister   . Leukemia Maternal Aunt   . Diabetes Neg Hx   . Heart disease Neg Hx   . Hypertension Neg Hx   . Colon cancer Neg Hx   . Esophageal cancer Neg Hx   . Rectal cancer Neg Hx   . Stomach cancer Neg Hx     Social History   Socioeconomic History  . Marital status: Married    Spouse name: Not on file  . Number of children: 0  . Years of education: Not on file  . Highest education level: Not on file  Occupational History  . 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 Smoking status: Never Smoker  . Smokeless tobacco: Never Used  Substance and Sexual Activity  . Alcohol use: Yes    Alcohol/week: 14.0 standard drinks    Types: 14 Standard drinks or equivalent per week    Comment: 1-2 daily at most  . Drug use: Not Currently  . Sexual activity: Not on file  Other Topics Concern  . Not on file  Social History Narrative  . Not on file   Social Determinants of Health   Financial Resource Strain:   . Difficulty of Paying Living Expenses:   Food Insecurity:   . Worried About Charity fundraiser in the Last Year:   . Arboriculturist in the Last Year:   Transportation Needs:   . Film/video editor (Medical):   Marland Kitchen Lack of Transportation (Non-Medical):   Physical Activity:   . Days of Exercise per Week:   . Minutes of Exercise per Session:   Stress:   . Feeling of Stress :   Social Connections:   . Frequency of Communication with Friends and Family:   . Frequency of Social Gatherings with Friends and Family:   . Attends Religious Services:   . Active Member of Clubs or Organizations:   . Attends Archivist Meetings:   Marland Kitchen Marital Status:   Intimate Partner Violence:   . Fear of Current or Ex-Partner:   . Emotionally  Abused:   Marland Kitchen Physically Abused:   . Sexually Abused:    Review of Systems Having sleep disorder due to the schedule Eating okay    Objective:   Physical Exam  Constitutional: He appears well-developed. No distress.  Psychiatric: He has a normal mood and affect. His behavior is normal.           Assessment & Plan:

## 2019-07-06 DIAGNOSIS — Z20828 Contact with and (suspected) exposure to other viral communicable diseases: Secondary | ICD-10-CM | POA: Diagnosis not present

## 2019-07-22 DIAGNOSIS — Z03818 Encounter for observation for suspected exposure to other biological agents ruled out: Secondary | ICD-10-CM | POA: Diagnosis not present

## 2019-07-22 DIAGNOSIS — Z20828 Contact with and (suspected) exposure to other viral communicable diseases: Secondary | ICD-10-CM | POA: Diagnosis not present

## 2019-12-17 ENCOUNTER — Encounter: Payer: BC Managed Care – PPO | Admitting: Internal Medicine

## 2020-05-04 DIAGNOSIS — F900 Attention-deficit hyperactivity disorder, predominantly inattentive type: Secondary | ICD-10-CM | POA: Diagnosis not present

## 2020-05-20 DIAGNOSIS — F902 Attention-deficit hyperactivity disorder, combined type: Secondary | ICD-10-CM | POA: Diagnosis not present

## 2020-05-20 DIAGNOSIS — F411 Generalized anxiety disorder: Secondary | ICD-10-CM | POA: Diagnosis not present

## 2020-06-15 DIAGNOSIS — Z20822 Contact with and (suspected) exposure to covid-19: Secondary | ICD-10-CM | POA: Diagnosis not present

## 2020-07-23 ENCOUNTER — Other Ambulatory Visit: Payer: Self-pay

## 2020-07-23 ENCOUNTER — Encounter: Payer: Self-pay | Admitting: Internal Medicine

## 2020-07-23 ENCOUNTER — Ambulatory Visit (INDEPENDENT_AMBULATORY_CARE_PROVIDER_SITE_OTHER)
Admission: RE | Admit: 2020-07-23 | Discharge: 2020-07-23 | Disposition: A | Discharge status: Home / Self Care | Payer: BC Managed Care – PPO | Source: Ambulatory Visit | Attending: Internal Medicine | Admitting: Internal Medicine

## 2020-07-23 ENCOUNTER — Ambulatory Visit (INDEPENDENT_AMBULATORY_CARE_PROVIDER_SITE_OTHER): Payer: BC Managed Care – PPO | Admitting: Internal Medicine

## 2020-07-23 VITALS — BP 110/74 | HR 72 | Temp 98.1°F | Ht 71.0 in | Wt 231.0 lb

## 2020-07-23 DIAGNOSIS — S99922A Unspecified injury of left foot, initial encounter: Secondary | ICD-10-CM | POA: Diagnosis not present

## 2020-07-23 DIAGNOSIS — M79672 Pain in left foot: Secondary | ICD-10-CM | POA: Insufficient documentation

## 2020-07-23 DIAGNOSIS — M7732 Calcaneal spur, left foot: Secondary | ICD-10-CM | POA: Diagnosis not present

## 2020-07-23 NOTE — Progress Notes (Signed)
Subjective:    Patient ID: Marcus Cochran, male    DOB: October 13, 1965, 55 y.o.   MRN: [id number]  HPI Here due to left ankle injury This visit occurred during the SARS-CoV-2 public health emergency.  Safety protocols were in place, including screening questions prior to the visit, additional usage of staff PPE, and extensive cleaning of exam room while observing appropriate contact time as indicated for disinfecting solutions.   Plays tennis a lot---3 weeks ago, he noticed pain in left foot when walking out after the game Didn't remember an injury Rested it for a week or so---seemed better Played again--only 30 minutes-- felt okay 2 days later, he played again---and felt a lot of pain within 20 minutes but finished the match Again decided to rest it--that was 2 weeks ago Still having pain--especially if he is on it Using a lace up brace  No clear swelling Tried ibuprofen 200mg -- unclear if helped (does okay with he rests anyway)  Current Outpatient Medications on File Prior to Visit  Medication Sig Dispense Refill  . Cholecalciferol 50 MCG (2000 UT) TABS Take 1 tablet by mouth daily.    . Multiple Vitamins-Minerals (EMERGEN-C IMMUNE PO) Take by mouth as needed.    . TURMERIC PO Take by mouth daily.     No current facility-administered medications on file prior to visit.    Allergies  Allergen Reactions  . Prednisone     REACTION: blurry vision    Past Medical History:  Diagnosis Date  . Allergy    seasonal  . Heart murmur    as a 55 year old  . Seizures ([place])    as a child and when smoking pot/ no meds- last one in his 20's    Past Surgical History:  Procedure Laterality Date  . HERNIA REPAIR Right ~1972   Inguinal  . KNEE ARTHROSCOPY Right 1981  . VASECTOMY      Family History  Problem Relation Age of Onset  . Multiple sclerosis Mother        possible  . Bipolar disorder Sister   . Leukemia Maternal Aunt   . Diabetes Neg Hx   . Heart disease Neg Hx   .  Hypertension Neg Hx   . Colon cancer Neg Hx   . Esophageal cancer Neg Hx   . Rectal cancer Neg Hx   . Stomach cancer Neg Hx     Social History   Socioeconomic History  . Marital status: Married    Spouse name: Not on file  . Number of children: 0  . Years of education: Not on file  . Highest education level: Not on file  Occupational History  . 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 Smoking status: Never Smoker  . Smokeless tobacco: Never Used  Vaping Use  . Vaping Use: Never used  Substance and Sexual Activity  . Alcohol use: Yes    Alcohol/week: 14.0 standard drinks    Types: 14 Standard drinks or equivalent per week    Comment: 1-2 daily at most  . Drug use: Not Currently  . Sexual activity: Not on file  Other Topics Concern  . Not on file  Social History Narrative  . Not on file   Social Determinants of Health   Financial Resource Strain: Not on file  Food Insecurity: Not on file  Transportation Needs: Not on file  Physical Activity: Not on file  Stress: Not on file  Social Connections: Not on file  Intimate Partner Violence:  Not on file   Review of Systems  Did try icing sometimes  No other joint issues     Objective:   Physical Exam Musculoskeletal:     Comments: Left ankle exam normal Tenderness over proximal part of first metatarsal (plantar) but not on the side  Neurological:     Comments: Limps on bare left foot            Assessment & Plan:

## 2020-07-23 NOTE — Assessment & Plan Note (Addendum)
Seems like medial arch injury---could be 1st metatarsal though Will check x-ray  If negative ---can use ibuprofen, brace and time  X-ray negative Ibuprofen prn Ice if hurts Continue the brace and makes sure shoes/arch support is correct

## 2020-07-26 DIAGNOSIS — M722 Plantar fascial fibromatosis: Secondary | ICD-10-CM | POA: Diagnosis not present

## 2020-07-26 DIAGNOSIS — M2140 Flat foot [pes planus] (acquired), unspecified foot: Secondary | ICD-10-CM | POA: Diagnosis not present

## 2020-07-26 DIAGNOSIS — M9906 Segmental and somatic dysfunction of lower extremity: Secondary | ICD-10-CM | POA: Diagnosis not present

## 2020-07-28 ENCOUNTER — Ambulatory Visit: Payer: BC Managed Care – PPO | Admitting: Internal Medicine

## 2020-07-30 DIAGNOSIS — M9906 Segmental and somatic dysfunction of lower extremity: Secondary | ICD-10-CM | POA: Diagnosis not present

## 2020-07-30 DIAGNOSIS — M2140 Flat foot [pes planus] (acquired), unspecified foot: Secondary | ICD-10-CM | POA: Diagnosis not present

## 2020-07-30 DIAGNOSIS — M722 Plantar fascial fibromatosis: Secondary | ICD-10-CM | POA: Diagnosis not present

## 2020-08-02 DIAGNOSIS — M2140 Flat foot [pes planus] (acquired), unspecified foot: Secondary | ICD-10-CM | POA: Diagnosis not present

## 2020-08-02 DIAGNOSIS — M9906 Segmental and somatic dysfunction of lower extremity: Secondary | ICD-10-CM | POA: Diagnosis not present

## 2020-08-02 DIAGNOSIS — M722 Plantar fascial fibromatosis: Secondary | ICD-10-CM | POA: Diagnosis not present

## 2020-08-04 DIAGNOSIS — M722 Plantar fascial fibromatosis: Secondary | ICD-10-CM | POA: Diagnosis not present

## 2020-08-04 DIAGNOSIS — M2140 Flat foot [pes planus] (acquired), unspecified foot: Secondary | ICD-10-CM | POA: Diagnosis not present

## 2020-08-04 DIAGNOSIS — M9906 Segmental and somatic dysfunction of lower extremity: Secondary | ICD-10-CM | POA: Diagnosis not present

## 2020-08-10 DIAGNOSIS — M9906 Segmental and somatic dysfunction of lower extremity: Secondary | ICD-10-CM | POA: Diagnosis not present

## 2020-08-10 DIAGNOSIS — M722 Plantar fascial fibromatosis: Secondary | ICD-10-CM | POA: Diagnosis not present

## 2020-08-10 DIAGNOSIS — M2140 Flat foot [pes planus] (acquired), unspecified foot: Secondary | ICD-10-CM | POA: Diagnosis not present

## 2020-08-13 DIAGNOSIS — M2140 Flat foot [pes planus] (acquired), unspecified foot: Secondary | ICD-10-CM | POA: Diagnosis not present

## 2020-08-13 DIAGNOSIS — M722 Plantar fascial fibromatosis: Secondary | ICD-10-CM | POA: Diagnosis not present

## 2020-08-13 DIAGNOSIS — M9906 Segmental and somatic dysfunction of lower extremity: Secondary | ICD-10-CM | POA: Diagnosis not present

## 2020-12-07 DIAGNOSIS — M7918 Myalgia, other site: Secondary | ICD-10-CM | POA: Diagnosis not present

## 2020-12-07 DIAGNOSIS — M25672 Stiffness of left ankle, not elsewhere classified: Secondary | ICD-10-CM | POA: Diagnosis not present

## 2020-12-07 DIAGNOSIS — M76822 Posterior tibial tendinitis, left leg: Secondary | ICD-10-CM | POA: Diagnosis not present

## 2020-12-07 DIAGNOSIS — M9906 Segmental and somatic dysfunction of lower extremity: Secondary | ICD-10-CM | POA: Diagnosis not present

## 2020-12-15 DIAGNOSIS — M7918 Myalgia, other site: Secondary | ICD-10-CM | POA: Diagnosis not present

## 2020-12-15 DIAGNOSIS — M25672 Stiffness of left ankle, not elsewhere classified: Secondary | ICD-10-CM | POA: Diagnosis not present

## 2020-12-15 DIAGNOSIS — M9906 Segmental and somatic dysfunction of lower extremity: Secondary | ICD-10-CM | POA: Diagnosis not present

## 2020-12-15 DIAGNOSIS — M76822 Posterior tibial tendinitis, left leg: Secondary | ICD-10-CM | POA: Diagnosis not present

## 2020-12-22 DIAGNOSIS — M7918 Myalgia, other site: Secondary | ICD-10-CM | POA: Diagnosis not present

## 2020-12-22 DIAGNOSIS — M79672 Pain in left foot: Secondary | ICD-10-CM | POA: Diagnosis not present

## 2020-12-22 DIAGNOSIS — M9906 Segmental and somatic dysfunction of lower extremity: Secondary | ICD-10-CM | POA: Diagnosis not present

## 2020-12-22 DIAGNOSIS — M25672 Stiffness of left ankle, not elsewhere classified: Secondary | ICD-10-CM | POA: Diagnosis not present

## 2020-12-22 DIAGNOSIS — M76822 Posterior tibial tendinitis, left leg: Secondary | ICD-10-CM | POA: Diagnosis not present

## 2021-01-25 ENCOUNTER — Telehealth: Payer: Self-pay

## 2021-01-25 NOTE — Telephone Encounter (Signed)
Pt's wife called requesting a TOC to Dr Jerline Pain. Nevin Bloodgood stated that her and Frederic has moved to [place] and would like to move to a provider closer to them. Can we schedule a TOC? Please Advise.

## 2021-01-26 NOTE — Telephone Encounter (Signed)
[place] with me.   Algis Greenhouse. Jerline Pain, MD 01/26/2021 8:07 AM

## 2021-01-27 NOTE — Telephone Encounter (Signed)
Pt is scheduled °

## 2021-02-28 ENCOUNTER — Encounter: Payer: BC Managed Care – PPO | Admitting: Internal Medicine

## 2021-03-09 ENCOUNTER — Ambulatory Visit (INDEPENDENT_AMBULATORY_CARE_PROVIDER_SITE_OTHER): Payer: BC Managed Care – PPO | Admitting: Family Medicine

## 2021-03-09 ENCOUNTER — Other Ambulatory Visit: Payer: Self-pay

## 2021-03-09 ENCOUNTER — Encounter: Payer: Self-pay | Admitting: Family Medicine

## 2021-03-09 VITALS — BP 118/75 | HR 79 | Temp 97.4°F | Ht 71.0 in | Wt 228.6 lb

## 2021-03-09 DIAGNOSIS — Z6831 Body mass index (BMI) 31.0-31.9, adult: Secondary | ICD-10-CM

## 2021-03-09 DIAGNOSIS — Z0001 Encounter for general adult medical examination with abnormal findings: Secondary | ICD-10-CM | POA: Diagnosis not present

## 2021-03-09 DIAGNOSIS — E669 Obesity, unspecified: Secondary | ICD-10-CM

## 2021-03-09 DIAGNOSIS — E559 Vitamin D deficiency, unspecified: Secondary | ICD-10-CM | POA: Diagnosis not present

## 2021-03-09 DIAGNOSIS — E538 Deficiency of other specified B group vitamins: Secondary | ICD-10-CM

## 2021-03-09 DIAGNOSIS — R739 Hyperglycemia, unspecified: Secondary | ICD-10-CM

## 2021-03-09 DIAGNOSIS — G4726 Circadian rhythm sleep disorder, shift work type: Secondary | ICD-10-CM | POA: Insufficient documentation

## 2021-03-09 DIAGNOSIS — R5383 Other fatigue: Secondary | ICD-10-CM

## 2021-03-09 DIAGNOSIS — R350 Frequency of micturition: Secondary | ICD-10-CM

## 2021-03-09 DIAGNOSIS — Z13220 Encounter for screening for lipoid disorders: Secondary | ICD-10-CM | POA: Diagnosis not present

## 2021-03-09 DIAGNOSIS — Z125 Encounter for screening for malignant neoplasm of prostate: Secondary | ICD-10-CM | POA: Diagnosis not present

## 2021-03-09 LAB — URINALYSIS, ROUTINE W REFLEX MICROSCOPIC
Bilirubin Urine: NEGATIVE
Hgb urine dipstick: NEGATIVE
Ketones, ur: NEGATIVE
Leukocytes,Ua: NEGATIVE
Nitrite: NEGATIVE
Specific Gravity, Urine: 1.025 (ref 1.000–1.030)
Total Protein, Urine: NEGATIVE
Urine Glucose: 100 — AB
Urobilinogen, UA: 0.2 (ref 0.0–1.0)
pH: 6.5 (ref 5.0–8.0)

## 2021-03-09 LAB — VITAMIN D 25 HYDROXY (VIT D DEFICIENCY, FRACTURES): VITD: 15.72 ng/mL — ABNORMAL LOW (ref 30.00–100.00)

## 2021-03-09 LAB — CBC
HCT: 41.8 % (ref 39.0–52.0)
Hemoglobin: 14.3 g/dL (ref 13.0–17.0)
MCHC: 34.2 g/dL (ref 30.0–36.0)
MCV: 89.9 fl (ref 78.0–100.0)
Platelets: 138 10*3/uL — ABNORMAL LOW (ref 150.0–400.0)
RBC: 4.65 Mil/uL (ref 4.22–5.81)
RDW: 13.5 % (ref 11.5–15.5)
WBC: 4.6 10*3/uL (ref 4.0–10.5)

## 2021-03-09 LAB — COMPREHENSIVE METABOLIC PANEL
ALT: 28 U/L (ref 0–53)
AST: 18 U/L (ref 0–37)
Albumin: 4.2 g/dL (ref 3.5–5.2)
Alkaline Phosphatase: 53 U/L (ref 39–117)
BUN: 12 mg/dL (ref 6–23)
CO2: 30 mEq/L (ref 19–32)
Calcium: 9.2 mg/dL (ref 8.4–10.5)
Chloride: 104 mEq/L (ref 96–112)
Creatinine, Ser: 0.97 mg/dL (ref 0.40–1.50)
GFR: 89.04 mL/min (ref >60.00)
Glucose, Bld: 128 mg/dL — ABNORMAL HIGH (ref 70–99)
Potassium: 4.3 mEq/L (ref 3.5–5.1)
Sodium: 140 mEq/L (ref 135–145)
Total Bilirubin: 0.5 mg/dL (ref 0.2–1.2)
Total Protein: 6.7 g/dL (ref 6.0–8.3)

## 2021-03-09 LAB — LIPID PANEL
Cholesterol: 224 mg/dL — ABNORMAL HIGH (ref 0–200)
HDL: 43.6 mg/dL (ref >39.00)
NonHDL: 179.96
Total CHOL/HDL Ratio: 5
Triglycerides: 246 mg/dL — ABNORMAL HIGH (ref 0.0–149.0)
VLDL: 49.2 mg/dL — ABNORMAL HIGH (ref 0.0–40.0)

## 2021-03-09 LAB — TSH: TSH: 1.25 u[IU]/mL (ref 0.35–5.50)

## 2021-03-09 LAB — LDL CHOLESTEROL, DIRECT: Direct LDL: 156 mg/dL

## 2021-03-09 LAB — HEMOGLOBIN A1C: Hgb A1c MFr Bld: 6.1 % (ref 4.6–6.5)

## 2021-03-09 LAB — PSA: PSA: 0.56 ng/mL (ref 0.10–4.00)

## 2021-03-09 LAB — VITAMIN B12: Vitamin B-12: 302 pg/mL (ref 211–911)

## 2021-03-09 NOTE — Progress Notes (Signed)
Chief Complaint:  Marcus Cochran is a 55 y.o. male who presents today for his annual comprehensive physical exam.    Assessment/Plan:  New/Acute Problems: Urinary Frequency May have mild BPH.  No red flag signs or symptoms.  Symptoms are currently manageable.  We will check UA and PSA today.  Other Fatigue Likely secondary to sleep disturbance.  We will check labs.  Chronic Problems Addressed Today: Shift work sleep disorder Likely contributing to his fatigue.  He will be starting melatonin.  He also uses advil PM as needed.  Vitamin D deficiency Check Vit D.   Body mass index is 31.88 kg/m. / Obese  BMI Metric Follow Up - 03/09/21 0844       BMI Metric Follow Up-Please document annually   BMI Metric Follow Up Education provided             Preventative Healthcare: Check labs.  Due for colonoscopy - he will follow up with GI.   Patient Counseling(The following topics were reviewed and/or handout was given):  -Nutrition: Stressed importance of moderation in sodium/caffeine intake, saturated fat and cholesterol, caloric balance, sufficient intake of fresh fruits, vegetables, and fiber.  -Stressed the importance of regular exercise.   -Substance Abuse: Discussed cessation/primary prevention of tobacco, alcohol, or other drug use; driving or other dangerous activities under the influence; availability of treatment for abuse.   -Injury prevention: Discussed safety belts, safety helmets, smoke detector, smoking near bedding or upholstery.   -Sexuality: Discussed sexually transmitted diseases, partner selection, use of condoms, avoidance of unintended pregnancy and contraceptive alternatives.   -Dental health: Discussed importance of regular tooth brushing, flossing, and dental visits.  -Health maintenance and immunizations reviewed. Please refer to Health maintenance section.  Return to care in 1 year for next preventative visit.     Subjective:  HPI:  He has no acute  complaints today.   See A/P for status of chronic conditions.  He has had ongoing issues with fatigue. Works alternative shifts at work which is likely contributing.  Occasionally has more urinary frequency.  No dysuria.  No nocturia.  Lifestyle Diet: None specific.  Exercise: Plays tennis several times per week.   Depression screen PHQ 2/9 03/09/2021  Decreased Interest 0  Down, Depressed, Hopeless 0  PHQ - 2 Score 0    Health Maintenance Due  Topic Date Due   HIV Screening  Never done   Hepatitis C Screening  Never done   Zoster Vaccines- Shingrix (1 of 2) Never done   COVID-19 Vaccine (3 - Booster for Moderna series) 06/23/2020   COLONOSCOPY (Pts 45-105yrs Insurance coverage will need to be confirmed)  12/28/2020     ROS: Per HPI, otherwise a complete review of systems was negative.   PMH:  The following were reviewed and entered/updated in epic: Past Medical History:  Diagnosis Date   Allergy    seasonal   Heart murmur    as a 55 year old   Seizures ([place])    as a child and when smoking pot/ no meds- last one in his 20's   Patient Active Problem List   Diagnosis Date Noted   Vitamin D deficiency 03/09/2021   Shift work sleep disorder 03/09/2021   Past Surgical History:  Procedure Laterality Date   HERNIA REPAIR Right ~1972   Inguinal   KNEE ARTHROSCOPY Right 1981   VASECTOMY      Family History  Problem Relation Age of Onset   Multiple sclerosis Mother  possible   Bipolar disorder Sister    Leukemia Maternal Aunt    Diabetes Neg Hx    Heart disease Neg Hx    Hypertension Neg Hx    Colon cancer Neg Hx    Esophageal cancer Neg Hx    Rectal cancer Neg Hx    Stomach cancer Neg Hx     Medications- reviewed and updated Current Outpatient Medications  Medication Sig Dispense Refill   Cholecalciferol 50 MCG (2000 UT) TABS Take 1 tablet by mouth daily.     Multiple Vitamins-Minerals (EMERGEN-C IMMUNE PO) Take by mouth as needed.     TURMERIC PO  Take by mouth daily.     No current facility-administered medications for this visit.    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[employer]  Tobacco Use   Smoking status: Never   Smokeless tobacco: Never  Vaping Use   Vaping Use: Never used  Substance and Sexual Activity   Alcohol use: Yes    Alcohol/week: 14.0 standard drinks    Types: 14 Standard drinks or equivalent per week    Comment: 1-2 daily at most   Drug use: Not Currently   Sexual activity: Not on file  Other Topics Concern   Not on file  Social History Narrative   Not on file   Social Determinants of Health   Financial Resource Strain: Not on file  Food Insecurity: Not on file  Transportation Needs: Not on file  Physical Activity: Not on file  Stress: Not on file  Social Connections: Not on file        Objective:  Physical Exam: BP 118/75    Pulse 79    Temp (!) 97.4 F (36.3 C) (Temporal)    Ht 5\' 11"  (1.803 m)    Wt 228 lb 9.6 oz (103.7 kg)    SpO2 97%    BMI 31.88 kg/m   Body mass index is 31.88 kg/m. Wt Readings from Last 3 Encounters:  03/09/21 228 lb 9.6 oz (103.7 kg)  07/23/20 231 lb (104.8 kg)  06/13/19 227 lb (103 kg)   Gen: NAD, resting comfortably HEENT: TMs normal bilaterally. OP clear. No thyromegaly noted.  CV: RRR with no murmurs appreciated Pulm: NWOB, CTAB with no crackles, wheezes, or rhonchi GI: Normal bowel sounds present. Soft, Nontender, Nondistended. MSK: no edema, cyanosis, or clubbing noted Skin: warm, dry Neuro: CN2-12 grossly intact. Strength 5/5 in upper and lower extremities. Reflexes symmetric and intact bilaterally.  Psych: Normal affect and thought content      Malayasia Mirkin M. Jerline Pain, MD 03/09/2021 8:44 AM

## 2021-03-09 NOTE — Assessment & Plan Note (Signed)
Likely contributing to his fatigue.  He will be starting melatonin.  He also uses advil PM as needed.

## 2021-03-09 NOTE — Patient Instructions (Signed)
It was very nice to see you today!  We will check blood work and a urine sample today.  Please continue working on diet and exercise.  We will see back in year.  Come back sooner if needed.  Take care, Dr Jerline Pain  PLEASE NOTE:  If you had any lab tests please let us know if you have not heard back within a few days. You may see your results on mychart before we have a chance to review them but we will give you a call once they are reviewed by [place]. If we ordered any referrals today, please let us know if you have not heard from their office within the next week.   Please try these tips to maintain a healthy lifestyle:  Eat at least 3 REAL meals and 1-2 snacks per day.  Aim for no more than 5 hours between eating.  If you eat breakfast, please do so within one hour of getting up.   Each meal should contain half fruits/vegetables, one quarter protein, and one quarter carbs (no bigger than a computer mouse)  Cut down on sweet beverages. This includes juice, soda, and sweet tea.   Drink at least 1 glass of water with each meal and aim for at least 8 glasses per day  Exercise at least 150 minutes every week.    Preventive Care 66-40 Years Old, Male Preventive care refers to lifestyle choices and visits with your health care provider that can promote health and wellness. Preventive care visits are also called wellness exams. What can I expect for my preventive care visit? Counseling During your preventive care visit, your health care provider may ask about your: Medical history, including: Past medical problems. Family medical history. Current health, including: Emotional well-being. Home life and relationship well-being. Sexual activity. Lifestyle, including: Alcohol, nicotine or tobacco, and drug use. Access to firearms. Diet, exercise, and sleep habits. Safety issues such as seatbelt and bike helmet use. Sunscreen use. Work and work Statistician. Physical exam Your health care  provider will check your: Height and weight. These may be used to calculate your BMI (body mass index). BMI is a measurement that tells if you are at a healthy weight. Waist circumference. This measures the distance around your waistline. This measurement also tells if you are at a healthy weight and may help predict your risk of certain diseases, such as type 2 diabetes and high blood pressure. Heart rate and blood pressure. Body temperature. Skin for abnormal spots. What immunizations do I need? Vaccines are usually given at various ages, according to a schedule. Your health care provider will recommend vaccines for you based on your age, medical history, and lifestyle or other factors, such as travel or where you work. What tests do I need? Screening Your health care provider may recommend screening tests for certain conditions. This may include: Lipid and cholesterol levels. Diabetes screening. This is done by checking your blood sugar (glucose) after you have not eaten for a while (fasting). Hepatitis B test. Hepatitis C test. HIV (human immunodeficiency virus) test. STI (sexually transmitted infection) testing, if you are at risk. Lung cancer screening. Prostate cancer screening. Colorectal cancer screening. Talk with your health care provider about your test results, treatment options, and if necessary, the need for more tests. Follow these instructions at home: Eating and drinking  Eat a diet that includes fresh fruits and vegetables, whole grains, lean protein, and low-fat dairy products. Take vitamin and mineral supplements as recommended by your health  care provider. Do not drink alcohol if your health care provider tells you not to drink. If you drink alcohol: Limit how much you have to 0-2 drinks a day. Know how much alcohol is in your drink. In the [place] one drink equals one 12 oz bottle of beer (355 mL), one 5 oz glass of wine (148 mL), or one 1 oz glass of hard liquor  (44 mL). Lifestyle Brush your teeth every morning and night with fluoride toothpaste. Floss one time each day. Exercise for at least 30 minutes 5 or more days each week. Do not use any products that contain nicotine or tobacco. These products include cigarettes, chewing tobacco, and vaping devices, such as e-cigarettes. If you need help quitting, ask your health care provider. Do not use drugs. If you are sexually active, practice safe sex. Use a condom or other form of protection to prevent STIs. Take aspirin only as told by your health care provider. Make sure that you understand how much to take and what form to take. Work with your health care provider to find out whether it is safe and beneficial for you to take aspirin daily. Find healthy ways to manage stress, such as: Meditation, yoga, or listening to music. Journaling. Talking to a trusted person. Spending time with friends and family. Minimize exposure to UV radiation to reduce your risk of skin cancer. Safety Always wear your seat belt while driving or riding in a vehicle. Do not drive: If you have been drinking alcohol. Do not ride with someone who has been drinking. When you are tired or distracted. While texting. If you have been using any mind-altering substances or drugs. Wear a helmet and other protective equipment during sports activities. If you have firearms in your house, make sure you follow all gun safety procedures. What's next? Go to your health care provider once a year for an annual wellness visit. Ask your health care provider how often you should have your eyes and teeth checked. Stay up to date on all vaccines. This information is not intended to replace advice given to you by your health care provider. Make sure you discuss any questions you have with your health care provider. Document Revised: 09/08/2020 Document Reviewed: 09/08/2020 Elsevier Patient Education  [employer].

## 2021-03-09 NOTE — Assessment & Plan Note (Signed)
Check Vit D 

## 2021-03-11 ENCOUNTER — Encounter: Payer: Self-pay | Admitting: Family Medicine

## 2021-03-11 ENCOUNTER — Other Ambulatory Visit: Payer: Self-pay | Admitting: *Deleted

## 2021-03-11 DIAGNOSIS — R7303 Prediabetes: Secondary | ICD-10-CM | POA: Insufficient documentation

## 2021-03-11 DIAGNOSIS — E785 Hyperlipidemia, unspecified: Secondary | ICD-10-CM | POA: Insufficient documentation

## 2021-03-11 MED ORDER — VITAMIN D (ERGOCALCIFEROL) 1.25 MG (50000 UNIT) PO CAPS: 50000.0000 [IU] | ORAL_CAPSULE | ORAL | 5 refills | Status: AC

## 2021-03-11 NOTE — Progress Notes (Signed)
Please inform patient of the following:  He has a little bit of sugar in his urine. This could explain some of his urinary frequency. His PSA was normal. His A1c is also in the prediabetic range. He does not need to start medications for this but he should continue working on diet and exercise and we can recheck in 6-12 months.   His cholesterol levels are also borderline elevated. Do not need to start medications but he should continue working on diet and exercise and we can recheck in a year.  Vitamin D is low. Recommend starting 50000IU weekly. Please send in new rx. We should recheck in 3-6 months.   Everything else is STABLE.  Algis Greenhouse. Jerline Pain, MD 03/11/2021 8:00 AM

## 2021-05-24 NOTE — Progress Notes (Incomplete)
? ?  Marcus Cochran is a 56 y.o. male who presents today for an office visit. ? ?Assessment/Plan:  ?New/Acute Problems: ?*** ? ?Chronic Problems Addressed Today: ?No problem-specific Assessment & Plan notes found for this encounter. ? ? ?  ?Subjective:  ?HPI: ? ?Patient here to 3 months follow up.  ? ?   ?  ?Objective:  ?Physical Exam: ?There were no vitals taken for this visit.  ?Gen: No acute distress, resting comfortably*** ?CV: Regular rate and rhythm with no murmurs appreciated ?Pulm: Normal work of breathing, clear to auscultation bilaterally with no crackles, wheezes, or rhonchi ?Neuro: Grossly normal, moves all extremities ?Psych: Normal affect and thought content ? ?   ? ? ?I,Marcus Cochran,acting as a Education administrator for Dimas Chyle, MD.,have documented all relevant documentation on the behalf of Dimas Chyle, MD,as directed by  Dimas Chyle, MD while in the presence of Dimas Chyle, MD.  ? ?*** ?Marcus Cochran. Jerline Pain, MD ?05/24/2021 3:39 PM  ? ?

## 2021-05-26 ENCOUNTER — Ambulatory Visit: Payer: BC Managed Care – PPO | Admitting: Family Medicine

## 2021-09-12 DIAGNOSIS — M9906 Segmental and somatic dysfunction of lower extremity: Secondary | ICD-10-CM | POA: Diagnosis not present

## 2021-09-12 DIAGNOSIS — M25672 Stiffness of left ankle, not elsewhere classified: Secondary | ICD-10-CM | POA: Diagnosis not present

## 2021-09-12 DIAGNOSIS — M7918 Myalgia, other site: Secondary | ICD-10-CM | POA: Diagnosis not present

## 2021-09-12 DIAGNOSIS — M76822 Posterior tibial tendinitis, left leg: Secondary | ICD-10-CM | POA: Diagnosis not present

## 2021-09-14 DIAGNOSIS — M9905 Segmental and somatic dysfunction of pelvic region: Secondary | ICD-10-CM | POA: Diagnosis not present

## 2021-09-14 DIAGNOSIS — M9903 Segmental and somatic dysfunction of lumbar region: Secondary | ICD-10-CM | POA: Diagnosis not present

## 2021-09-14 DIAGNOSIS — M5126 Other intervertebral disc displacement, lumbar region: Secondary | ICD-10-CM | POA: Diagnosis not present

## 2021-09-14 DIAGNOSIS — M9904 Segmental and somatic dysfunction of sacral region: Secondary | ICD-10-CM | POA: Diagnosis not present

## 2021-09-21 DIAGNOSIS — M9905 Segmental and somatic dysfunction of pelvic region: Secondary | ICD-10-CM | POA: Diagnosis not present

## 2021-09-21 DIAGNOSIS — M5126 Other intervertebral disc displacement, lumbar region: Secondary | ICD-10-CM | POA: Diagnosis not present

## 2021-09-21 DIAGNOSIS — M9904 Segmental and somatic dysfunction of sacral region: Secondary | ICD-10-CM | POA: Diagnosis not present

## 2021-09-21 DIAGNOSIS — M9903 Segmental and somatic dysfunction of lumbar region: Secondary | ICD-10-CM | POA: Diagnosis not present

## 2021-12-19 ENCOUNTER — Encounter: Payer: Self-pay | Admitting: *Deleted

## 2022-01-30 ENCOUNTER — Ambulatory Visit (INDEPENDENT_AMBULATORY_CARE_PROVIDER_SITE_OTHER): Payer: BC Managed Care – PPO | Admitting: Family Medicine

## 2022-01-30 VITALS — BP 130/77 | HR 71 | Temp 97.7°F | Ht 70.0 in | Wt 226.0 lb

## 2022-01-30 DIAGNOSIS — R7303 Prediabetes: Secondary | ICD-10-CM

## 2022-01-30 DIAGNOSIS — E785 Hyperlipidemia, unspecified: Secondary | ICD-10-CM | POA: Diagnosis not present

## 2022-01-30 DIAGNOSIS — E559 Vitamin D deficiency, unspecified: Secondary | ICD-10-CM | POA: Diagnosis not present

## 2022-01-30 DIAGNOSIS — L918 Other hypertrophic disorders of the skin: Secondary | ICD-10-CM | POA: Diagnosis not present

## 2022-01-30 DIAGNOSIS — D179 Benign lipomatous neoplasm, unspecified: Secondary | ICD-10-CM | POA: Diagnosis not present

## 2022-01-30 NOTE — Assessment & Plan Note (Signed)
Currently prescribed 50,000 IUs once weekly.  He will come back soon for CPE and we will recheck vitamin D at that time.

## 2022-01-30 NOTE — Assessment & Plan Note (Signed)
He will come back soon for CPE and we can check A1c at that time.

## 2022-01-30 NOTE — Assessment & Plan Note (Signed)
Discussed lifestyle modifications.  He will come back soon for CPE and we can recheck at that time.

## 2022-01-30 NOTE — Progress Notes (Signed)
   Marcus Cochran is a 56 y.o. male who presents today for an office visit.  Assessment/Plan:  New/Acute Problems: Skin Tags Patient desires removal.  We discussed methods to do this including removal with iris scissors for scar therapy.  He would like to proceed with cryotherapy..  This was performed below.  See below procedure note.  He tolerated well.  He will follow-up in a few weeks for CPE and we can repeat therapy as needed at that time.  Chronic Problems Addressed Today: Dyslipidemia Discussed lifestyle modifications.  He will come back soon for CPE and we can recheck at that time.  Lipoma Reassured patient.  Mass on neck consistent with lipoma.  No red flag signs or symptoms.  We discussed referral to surgery however he declined.  Prediabetes He will come back soon for CPE and we can check A1c at that time.  Vitamin D deficiency Currently prescribed 50,000 IUs once weekly.  He will come back soon for CPE and we will recheck vitamin D at that time.     Subjective:  HPI:  See A/p for status of conditions.  He has skin tags that he would like to have frozen today.  Is been present for years.  Had removal in the past.  He also has a mass in the back of his neck.  This does not bother him.  He is not sure if it is changed in size.  His wife would like to have him have it evaluated.  No Cochran.  No injuries.  Thinks it has been present for years.       Objective:  Physical Exam: BP 130/77   Pulse 71   Temp 97.7 F (36.5 C) (Temporal)   Ht '5\' 10"'$  (1.778 m)   Wt 226 lb (102.5 kg)   SpO2 96%   BMI 32.43 kg/m   Gen: No acute distress, resting comfortably Skin: Fully mobile 3 to 4 cm mass on midline posterior neck.  Small adjacent 1 to 2 cm mass on right posterior neck.  Numerous skin tags noted on neck, bilateral axilla, and left groin. Neuro: Grossly normal, moves all extremities Psych: Normal affect and thought content  Cryotherapy Procedure Note  Pre-operative  Diagnosis: Skin tags  Locations: Bilateral Axilla, neck, left groin  Indications: Therapeutic  Procedure Details  Patient informed of risks (permanent scarring, infection, light or dark discoloration, bleeding, infection, weakness, numbness and recurrence of the lesion) and benefits of the procedure and verbal informed consent obtained.  The areas are treated with liquid nitrogen therapy, frozen until ice ball extended 2 mm beyond lesion, allowed to thaw, and treated again. A total of 12 lesions were treated. The patient tolerated procedure well.  The patient was instructed on post-op care, warned that there may be blister formation, redness and Cochran. Recommend OTC analgesia as needed for Cochran.  Condition: Stable  Complications: none.         Marcus Cochran. Marcus Pain, MD 01/30/2022 1:57 PM

## 2022-01-30 NOTE — Patient Instructions (Signed)
It was very nice to see you today!  We froze your skin tags today.  These should turn black and fall off within the next 2 to 3 weeks.  The mass on the back of your neck is probably a lipoma or a sebaceous cyst.  This is benign.  Please let me know if you would like to have referral to see a surgeon to have this taken off.  Please come back to see Marcus Cochran in a few months for your physical.  Come back sooner if needed.  Take care, Dr Jerline Pain  PLEASE NOTE:  If you had any lab tests please let Marcus Cochran know if you have not heard back within a few days. You may see your results on mychart before we have a chance to review them but we will give you a call once they are reviewed by Marcus Cochran. If we ordered any referrals today, please let Marcus Cochran know if you have not heard from their office within the next week.   Please try these tips to maintain a healthy lifestyle:  Eat at least 3 REAL meals and 1-2 snacks per day.  Aim for no more than 5 hours between eating.  If you eat breakfast, please do so within one hour of getting up.   Each meal should contain half fruits/vegetables, one quarter protein, and one quarter carbs (no bigger than a computer mouse)  Cut down on sweet beverages. This includes juice, soda, and sweet tea.   Drink at least 1 glass of water with each meal and aim for at least 8 glasses per day  Exercise at least 150 minutes every week.   Lipoma  A lipoma is a noncancerous (benign) tumor that is made up of fat cells. This is a very common type of soft-tissue growth. Lipomas are usually found under the skin (subcutaneous). They may occur in any tissue of the body that contains fat. Common areas for lipomas to appear include the back, arms, shoulders, buttocks, and thighs. Lipomas grow slowly, and they are usually painless. Most lipomas do not cause problems and do not require treatment. What are the causes? The cause of this condition is not known. What increases the risk? You are more likely to  develop this condition if: You are 5-65 years old. You have a family history of lipomas. What are the signs or symptoms? A lipoma usually appears as a small, round bump under the skin. In most cases, the lump will: Feel soft or rubbery. Not cause pain or other symptoms. However, if a lipoma is located in an area where it pushes on nerves, it can become painful or cause other symptoms. How is this diagnosed? A lipoma can usually be diagnosed with a physical exam. You may also have tests to confirm the diagnosis and to rule out other conditions. Tests may include: Imaging tests, such as a CT scan or an MRI. Removal of a tissue sample to be looked at under a microscope (biopsy). How is this treated? Treatment for this condition depends on the size of the lipoma and whether it is causing any symptoms. For small lipomas that are not causing problems, no treatment is needed. If a lipoma is bigger or it causes problems, surgery may be done to remove the lipoma. Lipomas can also be removed to improve appearance. Most often, the procedure is done after applying a medicine that numbs the area (local anesthetic). Liposuction may be done to reduce the size of the lipoma before it is removed  through surgery, or it may be done to remove the lipoma. Lipomas are removed with this method to limit incision size and scarring. A liposuction tube is inserted through a small incision into the lipoma, and the contents of the lipoma are removed through the tube with suction. Follow these instructions at home: Watch your lipoma for any changes. Keep all follow-up visits. This is important. Where to find more information OrthoInfo: orthoinfo.aaos.org Contact a health care provider if: Your lipoma becomes larger or hard. Your lipoma becomes painful, red, or increasingly swollen. These could be signs of infection or a more serious condition. Get help right away if: You develop tingling or numbness in an area near the  lipoma. This could indicate that the lipoma is causing nerve damage. Summary A lipoma is a noncancerous tumor that is made up of fat cells. Most lipomas do not cause problems and do not require treatment. If a lipoma is bigger or it causes problems, surgery may be done to remove the lipoma. Contact a health care provider if your lipoma becomes larger or hard, or if it becomes painful, red, or increasingly swollen. These could be signs of infection or a more serious condition. This information is not intended to replace advice given to you by your health care provider. Make sure you discuss any questions you have with your health care provider. Document Revised: 04/01/2021 Document Reviewed: 04/01/2021 Elsevier Patient Education  [place].

## 2022-01-30 NOTE — Assessment & Plan Note (Signed)
Reassured patient.  Mass on neck consistent with lipoma.  No red flag signs or symptoms.  We discussed referral to surgery however he declined.

## 2022-03-17 ENCOUNTER — Encounter: Payer: BC Managed Care – PPO | Admitting: Family Medicine

## 2022-03-31 ENCOUNTER — Encounter: Payer: BC Managed Care – PPO | Admitting: Family Medicine

## 2022-04-19 ENCOUNTER — Ambulatory Visit (INDEPENDENT_AMBULATORY_CARE_PROVIDER_SITE_OTHER): Payer: BC Managed Care – PPO | Admitting: Family Medicine

## 2022-04-19 ENCOUNTER — Encounter: Payer: Self-pay | Admitting: Family Medicine

## 2022-04-19 VITALS — BP 132/83 | HR 88 | Temp 97.1°F | Ht 70.0 in | Wt 225.0 lb

## 2022-04-19 DIAGNOSIS — M25561 Pain in right knee: Secondary | ICD-10-CM

## 2022-04-19 DIAGNOSIS — E559 Vitamin D deficiency, unspecified: Secondary | ICD-10-CM | POA: Diagnosis not present

## 2022-04-19 DIAGNOSIS — R7303 Prediabetes: Secondary | ICD-10-CM | POA: Diagnosis not present

## 2022-04-19 DIAGNOSIS — Z0001 Encounter for general adult medical examination with abnormal findings: Secondary | ICD-10-CM

## 2022-04-19 DIAGNOSIS — E785 Hyperlipidemia, unspecified: Secondary | ICD-10-CM | POA: Diagnosis not present

## 2022-04-19 DIAGNOSIS — Z1211 Encounter for screening for malignant neoplasm of colon: Secondary | ICD-10-CM

## 2022-04-19 LAB — HEMOGLOBIN A1C: Hgb A1c MFr Bld: 6.1 % (ref 4.6–6.5)

## 2022-04-19 LAB — COMPREHENSIVE METABOLIC PANEL
ALT: 25 U/L (ref 0–53)
AST: 17 U/L (ref 0–37)
Albumin: 4.4 g/dL (ref 3.5–5.2)
Alkaline Phosphatase: 52 U/L (ref 39–117)
BUN: 14 mg/dL (ref 6–23)
CO2: 28 mEq/L (ref 19–32)
Calcium: 9.3 mg/dL (ref 8.4–10.5)
Chloride: 103 mEq/L (ref 96–112)
Creatinine, Ser: 0.83 mg/dL (ref 0.40–1.50)
GFR: 99.05 mL/min (ref >60.00)
Glucose, Bld: 113 mg/dL — ABNORMAL HIGH (ref 70–99)
Potassium: 4.2 mEq/L (ref 3.5–5.1)
Sodium: 139 mEq/L (ref 135–145)
Total Bilirubin: 0.5 mg/dL (ref 0.2–1.2)
Total Protein: 6.7 g/dL (ref 6.0–8.3)

## 2022-04-19 LAB — CBC
HCT: 43.7 % (ref 39.0–52.0)
Hemoglobin: 15.1 g/dL (ref 13.0–17.0)
MCHC: 34.5 g/dL (ref 30.0–36.0)
MCV: 90.1 fl (ref 78.0–100.0)
Platelets: 192 10*3/uL (ref 150.0–400.0)
RBC: 4.85 Mil/uL (ref 4.22–5.81)
RDW: 13.3 % (ref 11.5–15.5)
WBC: 5 10*3/uL (ref 4.0–10.5)

## 2022-04-19 LAB — LDL CHOLESTEROL, DIRECT: Direct LDL: 152 mg/dL

## 2022-04-19 LAB — LIPID PANEL
Cholesterol: 222 mg/dL — ABNORMAL HIGH (ref 0–200)
HDL: 43.3 mg/dL (ref >39.00)
NonHDL: 178.46
Total CHOL/HDL Ratio: 5
Triglycerides: 227 mg/dL — ABNORMAL HIGH (ref 0.0–149.0)
VLDL: 45.4 mg/dL — ABNORMAL HIGH (ref 0.0–40.0)

## 2022-04-19 LAB — VITAMIN D 25 HYDROXY (VIT D DEFICIENCY, FRACTURES): VITD: 26.75 ng/mL — ABNORMAL LOW (ref 30.00–100.00)

## 2022-04-19 LAB — TSH: TSH: 0.89 u[IU]/mL (ref 0.35–5.50)

## 2022-04-19 NOTE — Patient Instructions (Addendum)
It was very nice to see you today!  I think you have some arthritis in your knee.  Please work on your exercises.   Please use compression and ice.  You can use over-the-counter meds as needed.  Will check blood work today.  Please work on diet and exercise.  I will refer you for colonoscopy.  We will see back in year for your next physical.  Come back sooner if needed.  Take care, Dr Jerline Pain  PLEASE NOTE:  If you had any lab tests, please let us know if you have not heard back within a few days. You may see your results on mychart before we have a chance to review them but we will give you a call once they are reviewed by [place].   If we ordered any referrals today, please let us know if you have not heard from their office within the next week.   If you had any urgent prescriptions sent in today, please check with the pharmacy within an hour of our visit to make sure the prescription was transmitted appropriately.   Please try these tips to maintain a healthy lifestyle:  Eat at least 3 REAL meals and 1-2 snacks per day.  Aim for no more than 5 hours between eating.  If you eat breakfast, please do so within one hour of getting up.   Each meal should contain half fruits/vegetables, one quarter protein, and one quarter carbs (no bigger than a computer mouse)  Cut down on sweet beverages. This includes juice, soda, and sweet tea.   Drink at least 1 glass of water with each meal and aim for at least 8 glasses per day  Exercise at least 150 minutes every week.    Preventive Care 65-53 Years Old, Male Preventive care refers to lifestyle choices and visits with your health care provider that can promote health and wellness. Preventive care visits are also called wellness exams. What can I expect for my preventive care visit? Counseling During your preventive care visit, your health care provider may ask about your: Medical history, including: Past medical problems. Family medical  history. Current health, including: Emotional well-being. Home life and relationship well-being. Sexual activity. Lifestyle, including: Alcohol, nicotine or tobacco, and drug use. Access to firearms. Diet, exercise, and sleep habits. Safety issues such as seatbelt and bike helmet use. Sunscreen use. Work and work Statistician. Physical exam Your health care provider will check your: Height and weight. These may be used to calculate your BMI (body mass index). BMI is a measurement that tells if you are at a healthy weight. Waist circumference. This measures the distance around your waistline. This measurement also tells if you are at a healthy weight and may help predict your risk of certain diseases, such as type 2 diabetes and high blood pressure. Heart rate and blood pressure. Body temperature. Skin for abnormal spots. What immunizations do I need?  Vaccines are usually given at various ages, according to a schedule. Your health care provider will recommend vaccines for you based on your age, medical history, and lifestyle or other factors, such as travel or where you work. What tests do I need? Screening Your health care provider may recommend screening tests for certain conditions. This may include: Lipid and cholesterol levels. Diabetes screening. This is done by checking your blood sugar (glucose) after you have not eaten for a while (fasting). Hepatitis B test. Hepatitis C test. HIV (human immunodeficiency virus) test. STI (sexually transmitted infection) testing, if  you are at risk. Lung cancer screening. Prostate cancer screening. Colorectal cancer screening. Talk with your health care provider about your test results, treatment options, and if necessary, the need for more tests. Follow these instructions at home: Eating and drinking  Eat a diet that includes fresh fruits and vegetables, whole grains, lean protein, and low-fat dairy products. Take vitamin and mineral  supplements as recommended by your health care provider. Do not drink alcohol if your health care provider tells you not to drink. If you drink alcohol: Limit how much you have to 0-2 drinks a day. Know how much alcohol is in your drink. In the [place] one drink equals one 12 oz bottle of beer (355 mL), one 5 oz glass of wine (148 mL), or one 1 oz glass of hard liquor (44 mL). Lifestyle Brush your teeth every morning and night with fluoride toothpaste. Floss one time each day. Exercise for at least 30 minutes 5 or more days each week. Do not use any products that contain nicotine or tobacco. These products include cigarettes, chewing tobacco, and vaping devices, such as e-cigarettes. If you need help quitting, ask your health care provider. Do not use drugs. If you are sexually active, practice safe sex. Use a condom or other form of protection to prevent STIs. Take aspirin only as told by your health care provider. Make sure that you understand how much to take and what form to take. Work with your health care provider to find out whether it is safe and beneficial for you to take aspirin daily. Find healthy ways to manage stress, such as: Meditation, yoga, or listening to music. Journaling. Talking to a trusted person. Spending time with friends and family. Minimize exposure to UV radiation to reduce your risk of skin cancer. Safety Always wear your seat belt while driving or riding in a vehicle. Do not drive: If you have been drinking alcohol. Do not ride with someone who has been drinking. When you are tired or distracted. While texting. If you have been using any mind-altering substances or drugs. Wear a helmet and other protective equipment during sports activities. If you have firearms in your house, make sure you follow all gun safety procedures. What's next? Go to your health care provider once a year for an annual wellness visit. Ask your health care provider how often you should  have your eyes and teeth checked. Stay up to date on all vaccines. This information is not intended to replace advice given to you by your health care provider. Make sure you discuss any questions you have with your health care provider. Document Revised: 09/08/2020 Document Reviewed: 09/08/2020 Elsevier Patient Education  [place].

## 2022-04-19 NOTE — Assessment & Plan Note (Signed)
Check lipids. Discussed lifestyle modifications.  

## 2022-04-19 NOTE — Assessment & Plan Note (Signed)
Check vitamin D.

## 2022-04-19 NOTE — Progress Notes (Signed)
Chief Complaint:  Marcus Cochran is a 57 y.o. male who presents today for his annual comprehensive physical exam.    Assessment/Plan:  New/Acute Problems: Right Knee Pain No red flags.  Likely osteoarthritis.  Mild crepitus on exam today.  No signs of ligamentous or meniscal injury.  We discussed conservative measures including compression, ice, and over-the-counter anti-inflammatories.  Also discussed home exercises and handout was given.  He will let me know if not improving in the next couple of weeks and would consider x-ray versus referral to PT or sports medicine at that time.  Chronic Problems Addressed Today: Vitamin D deficiency Check vitamin D.  Prediabetes Discussed lifestyle applications.  Check A1c.  He will look into seeing if insurance will pay for nutritionist.  Dyslipidemia Check lipids.  Discussed lifestyle modifications.  Preventative Healthcare: Check labs.  Vaccines deferred.  Needs colonoscopy-will refer today.  Patient Counseling(The following topics were reviewed and/or handout was given):  -Nutrition: Stressed importance of moderation in sodium/caffeine intake, saturated fat and cholesterol, caloric balance, sufficient intake of fresh fruits, vegetables, and fiber.  -Stressed the importance of regular exercise.   -Substance Abuse: Discussed cessation/primary prevention of tobacco, alcohol, or other drug use; driving or other dangerous activities under the influence; availability of treatment for abuse.   -Injury prevention: Discussed safety belts, safety helmets, smoke detector, smoking near bedding or upholstery.   -Sexuality: Discussed sexually transmitted diseases, partner selection, use of condoms, avoidance of unintended pregnancy and contraceptive alternatives.   -Dental health: Discussed importance of regular tooth brushing, flossing, and dental visits.  -Health maintenance and immunizations reviewed. Please refer to Health maintenance section.  Return  to care in 1 year for next preventative visit.     Subjective:  HPI:  He injured his right knee a few weeks ago. Thinks the he injured by "going to hard" while playing tennis and pickleball. Over the last the last couple of weeks has noticed some swelling. Worse with certain activities. Better with rest. Tried ibuprofen and aleve with modest improvement. He did have a ligament tear in that knee when was 14.   Lifestyle Diet: Occasionally tries intermittent fasting. Trying to cut down on sweets.  Exercise: Trying to walk more.      04/19/2022    8:07 AM  Depression screen PHQ 2/9  Decreased Interest 0  Down, Depressed, Hopeless 0  PHQ - 2 Score 0    There are no preventive care reminders to display for this patient.   ROS: Per HPI, otherwise a complete review of systems was negative.   PMH:  The following were reviewed and entered/updated in epic: Past Medical History:  Diagnosis Date   Allergy    seasonal   Heart murmur    as a 57 year old   Seizures ([place])    as a child and when smoking pot/ no meds- last one in his 20's   Patient Active Problem List   Diagnosis Date Noted   Lipoma 01/30/2022   Prediabetes 03/11/2021   Dyslipidemia 03/11/2021   Vitamin D deficiency 03/09/2021   Shift work sleep disorder 03/09/2021   Past Surgical History:  Procedure Laterality Date   HERNIA REPAIR Right ~1972   Inguinal   KNEE ARTHROSCOPY Right 1981   VASECTOMY      Family History  Problem Relation Age of Onset   Multiple sclerosis Mother        possible   Bipolar disorder Sister    Leukemia Maternal Aunt    Diabetes  Neg Hx    Heart disease Neg Hx    Hypertension Neg Hx    Colon cancer Neg Hx    Esophageal cancer Neg Hx    Rectal cancer Neg Hx    Stomach cancer Neg Hx     Medications- reviewed and updated Current Outpatient Medications  Medication Sig Dispense Refill   Cholecalciferol 50 MCG (2000 UT) TABS Take 1 tablet by mouth daily.     Multiple  Vitamins-Minerals (EMERGEN-C IMMUNE PO) Take by mouth as needed.     TURMERIC PO Take by mouth daily.     Vitamin D, Ergocalciferol, (DRISDOL) 1.25 MG (50000 UNIT) CAPS capsule Take 1 capsule (50,000 Units total) by mouth every 7 (seven) days. 4 capsule 5   No current facility-administered medications for this visit.    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[employer]  Tobacco Use   Smoking status: Never   Smokeless tobacco: Never  Vaping Use   Vaping Use: Never used  Substance and Sexual Activity   Alcohol use: Yes    Alcohol/week: 14.0 standard drinks of alcohol    Types: 14 Standard drinks or equivalent per week    Comment: 1-2 daily at most   Drug use: Not Currently   Sexual activity: Not on file  Other Topics Concern   Not on file  Social History Narrative   Not on file   Social Determinants of Health   Financial Resource Strain: Not on file  Food Insecurity: Not on file  Transportation Needs: Not on file  Physical Activity: Not on file  Stress: Not on file  Social Connections: Not on file        Objective:  Physical Exam: BP 132/83   Pulse 88   Temp (!) 97.1 F (36.2 C) (Temporal)   Ht '5\' 10"'$  (1.778 m)   Wt 225 lb (102.1 kg)   SpO2 95%   BMI 32.28 kg/m   Body mass index is 32.28 kg/m. Wt Readings from Last 3 Encounters:  04/19/22 225 lb (102.1 kg)  01/30/22 226 lb (102.5 kg)  03/09/21 228 lb 9.6 oz (103.7 kg)   Gen: NAD, resting comfortably HEENT: TMs normal bilaterally. OP clear. No thyromegaly noted.  CV: RRR with no murmurs appreciated Pulm: NWOB, CTAB with no crackles, wheezes, or rhonchi GI: Normal bowel sounds present. Soft, Nontender,  Nondistended. MSK: no edema, cyanosis, or clubbing noted - Right Knee: No deformities.  No effusion.  Slight crepitus noted with active and passive range of motion.  Stable to varus and valgus stress.  Anterior and posterior drawer signs negative.  Neurovascular intact distally. Skin: warm, dry Neuro: CN2-12 grossly intact. Strength 5/5 in upper and lower extremities. Reflexes symmetric and intact bilaterally.  Psych: Normal affect and thought content     Tynell Winchell M. Jerline Pain, MD 04/19/2022 8:47 AM

## 2022-04-19 NOTE — Assessment & Plan Note (Signed)
Discussed lifestyle applications.  Check A1c.  He will look into seeing if insurance will pay for nutritionist.

## 2022-04-21 NOTE — Progress Notes (Signed)
Please inform patient of the following:  Vitamin D a little low.  Recommend he continue 1000 IUs daily.  We can recheck again in 3 to 6 months.  Blood sugar still in the prediabetic range but stable.  He should continue to work on diet and exercise.  His cholesterol levels are elevated.  Do not need to start meds but he should focus on diet and exercise as above and we can recheck in year.  Everything else is normal.

## 2022-05-01 ENCOUNTER — Other Ambulatory Visit: Payer: Self-pay

## 2022-05-01 DIAGNOSIS — E559 Vitamin D deficiency, unspecified: Secondary | ICD-10-CM

## 2022-07-05 ENCOUNTER — Other Ambulatory Visit (INDEPENDENT_AMBULATORY_CARE_PROVIDER_SITE_OTHER): Payer: BC Managed Care – PPO

## 2022-07-05 DIAGNOSIS — E559 Vitamin D deficiency, unspecified: Secondary | ICD-10-CM | POA: Diagnosis not present

## 2022-07-05 LAB — VITAMIN D 25 HYDROXY (VIT D DEFICIENCY, FRACTURES): VITD: 28.85 ng/mL — ABNORMAL LOW (ref 30.00–100.00)

## 2022-07-07 NOTE — Progress Notes (Signed)
Please inform patient of the following:  Vitamin D still low.  Recommend he increase to 5000 IUs daily and we will recheck in 3 to 6 months.

## 2023-02-12 ENCOUNTER — Encounter: Payer: Self-pay | Admitting: Gastroenterology

## 2023-02-19 DIAGNOSIS — S0502XA Injury of conjunctiva and corneal abrasion without foreign body, left eye, initial encounter: Secondary | ICD-10-CM | POA: Diagnosis not present

## 2023-02-21 DIAGNOSIS — S0502XD Injury of conjunctiva and corneal abrasion without foreign body, left eye, subsequent encounter: Secondary | ICD-10-CM | POA: Diagnosis not present

## 2023-02-26 DIAGNOSIS — S0502XD Injury of conjunctiva and corneal abrasion without foreign body, left eye, subsequent encounter: Secondary | ICD-10-CM | POA: Diagnosis not present

## 2023-04-19 DIAGNOSIS — H47292 Other optic atrophy, left eye: Secondary | ICD-10-CM | POA: Diagnosis not present

## 2023-04-19 IMAGING — DX DG FOOT COMPLETE 3+V*L*
3 series · 3 of 3 positions shown · non-contrast
Comparison: None.

CLINICAL DATA: Pain following injury

EXAM:
LEFT FOOT - COMPLETE 3+ VIEW

[foot ap]
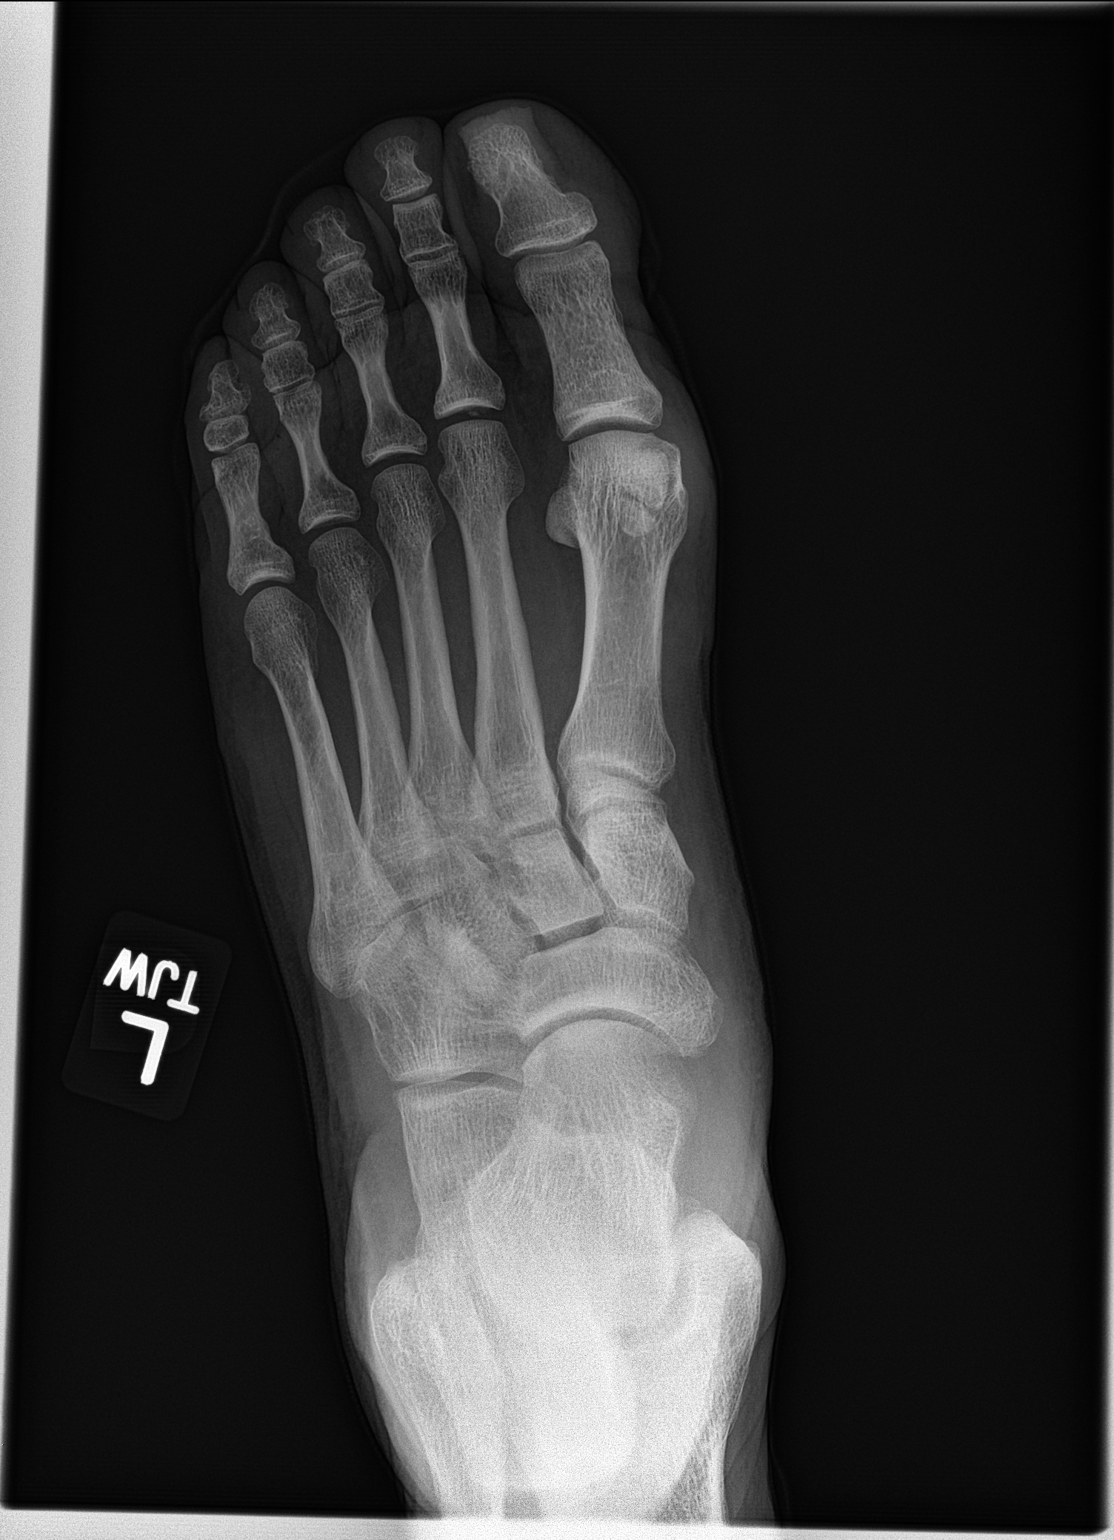

[foot obl]
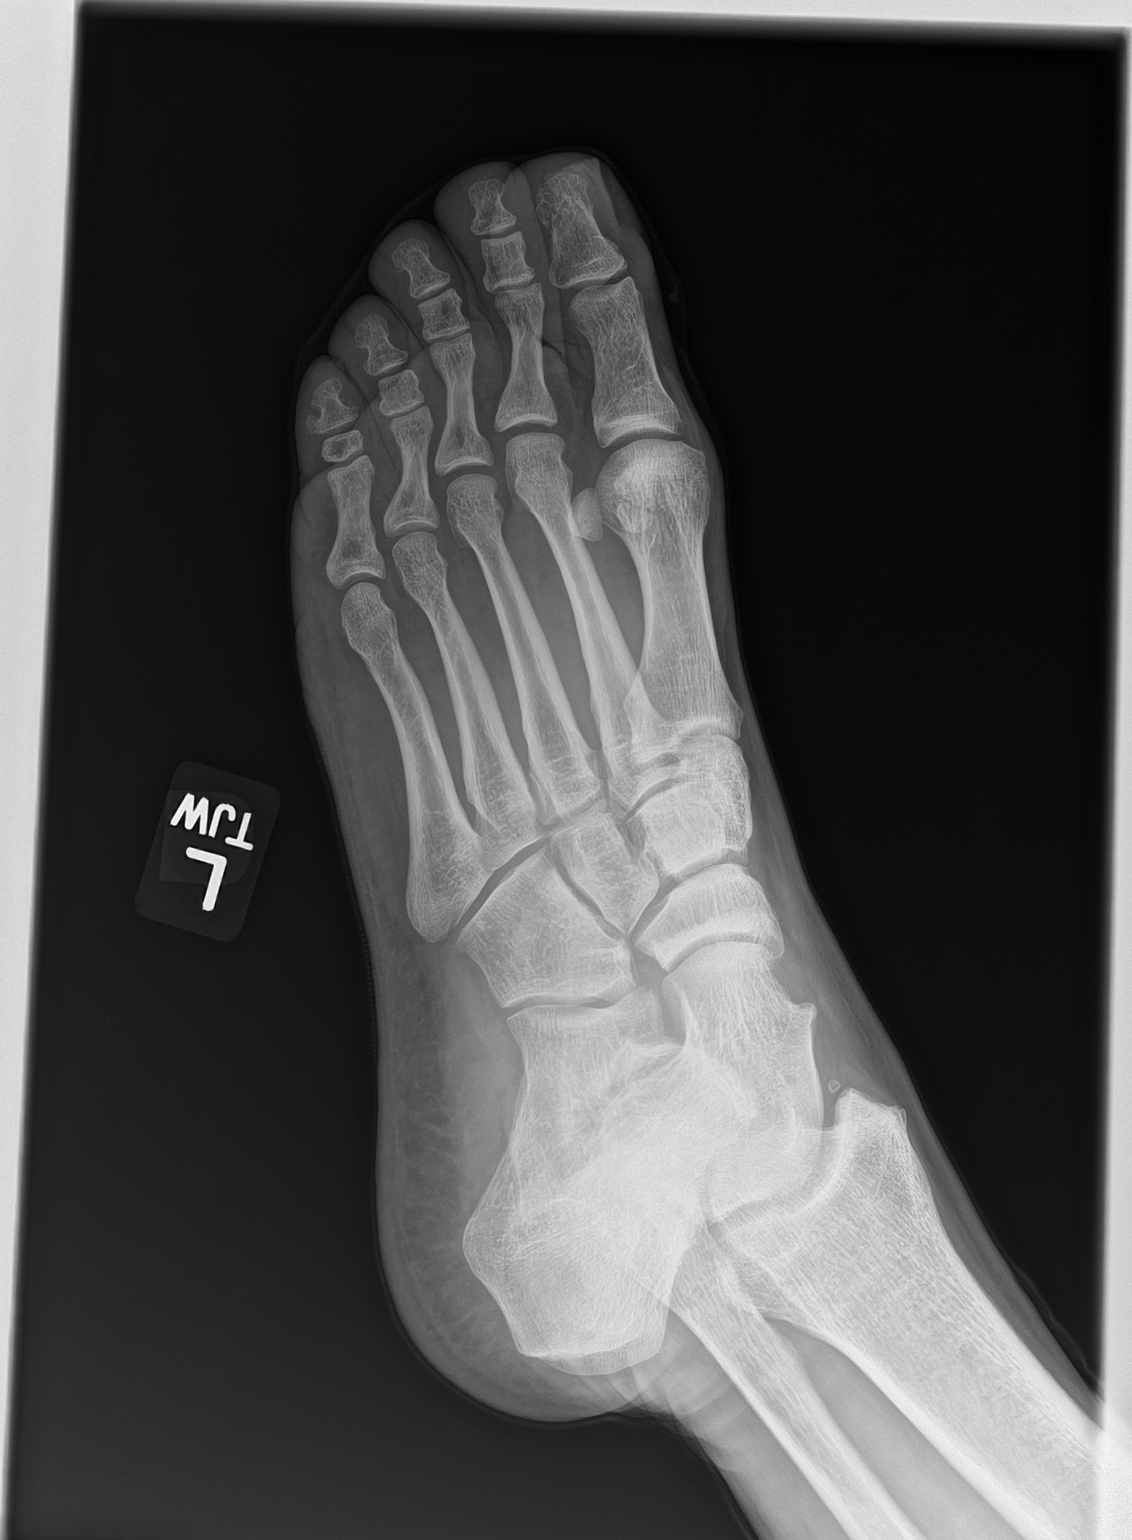

[foot lat]
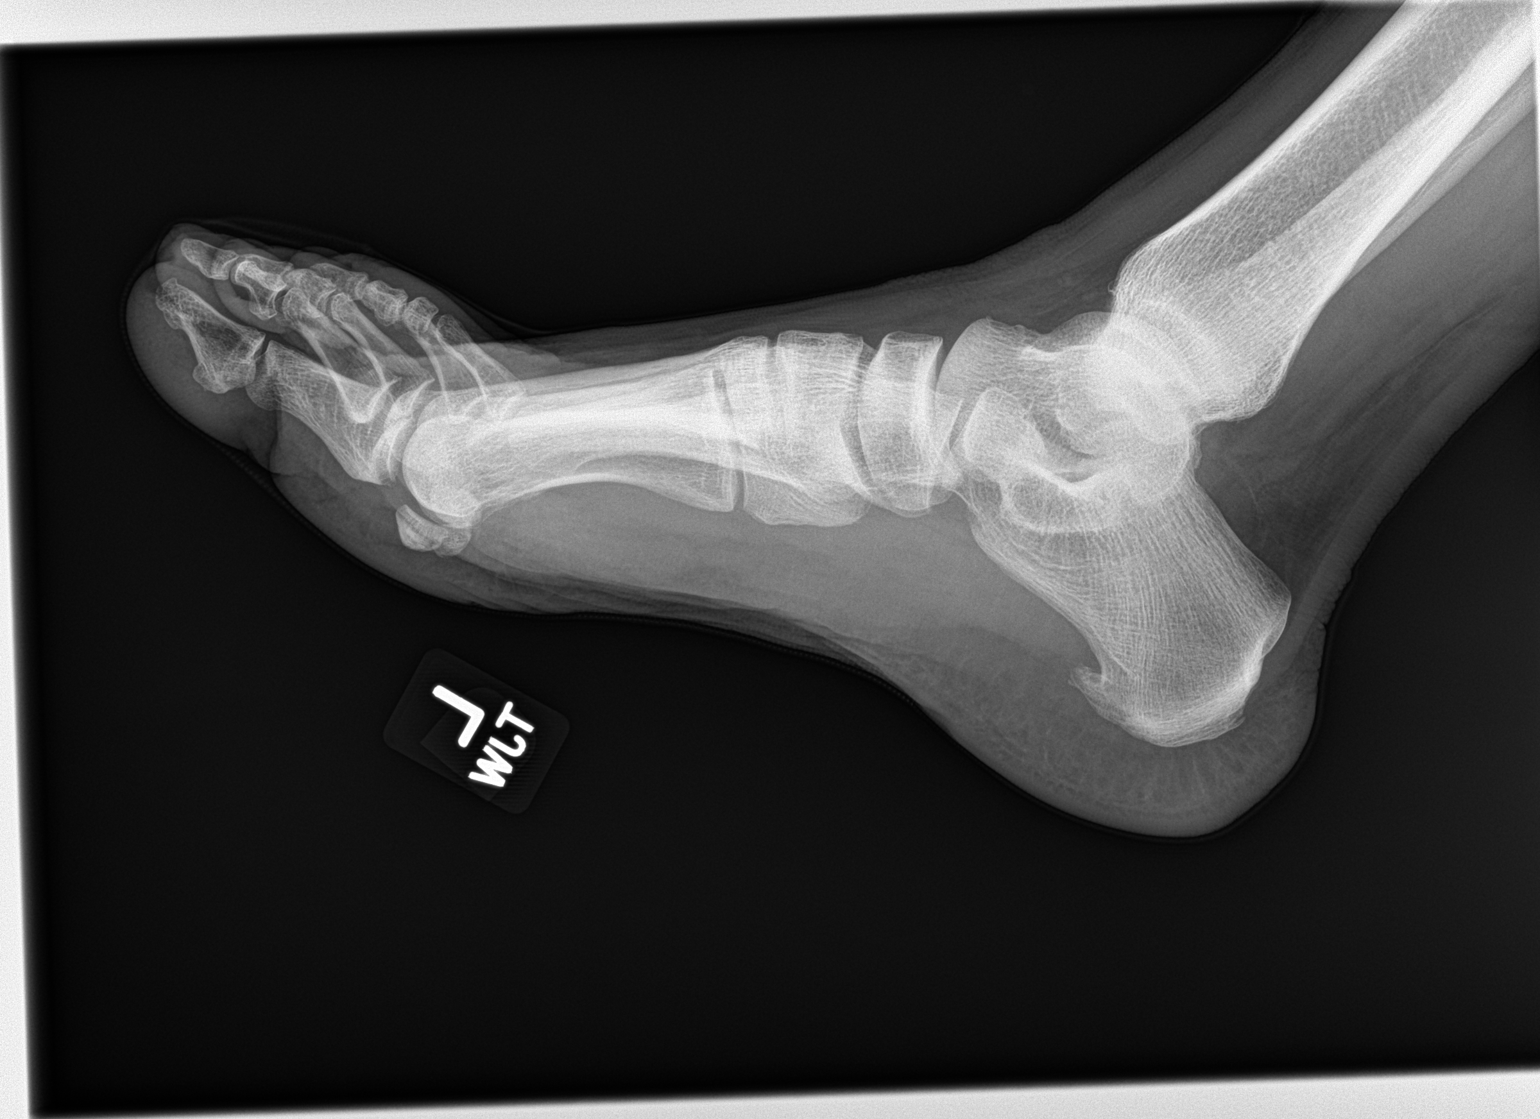

[3 of 3 positions shown; findings below may reference images not displayed]

FINDINGS: Frontal, oblique, and lateral views were obtained. No fracture or
dislocation. No appreciable joint space narrowing. Minimal
calcification is noted in the second MTP joint. No erosion. There is
an inferior calcaneal spur.
IMPRESSION: No evident fracture or dislocation. Minimal calcification in the
second MTP joint likely is of arthropathic etiology. This finding
potentially could represent residua of old trauma. No appreciable
joint space narrowing or erosion. There is an inferior calcaneal
spur.
